# Patient Record
Sex: Male | Born: 1985 | Race: Black or African American | Hispanic: No | Smoking: Current every day smoker
Health system: Southern US, Community
[De-identification: ages and names within clinical notes are randomized; demographics above are authoritative.]

## PROBLEM LIST (undated history)

## (undated) DIAGNOSIS — J189 Pneumonia, unspecified organism: Secondary | ICD-10-CM

## (undated) DIAGNOSIS — F329 Major depressive disorder, single episode, unspecified: Secondary | ICD-10-CM

## (undated) DIAGNOSIS — K219 Gastro-esophageal reflux disease without esophagitis: Secondary | ICD-10-CM

## (undated) DIAGNOSIS — F32A Depression, unspecified: Secondary | ICD-10-CM

## (undated) DIAGNOSIS — M1990 Unspecified osteoarthritis, unspecified site: Secondary | ICD-10-CM

## (undated) DIAGNOSIS — T7840XA Allergy, unspecified, initial encounter: Secondary | ICD-10-CM

## (undated) HISTORY — PX: COLONOSCOPY: SHX174

## (undated) HISTORY — PX: HIP SURGERY: SHX245

## (undated) HISTORY — DX: Allergy, unspecified, initial encounter: T78.40XA

## (undated) HISTORY — DX: Unspecified osteoarthritis, unspecified site: M19.90

## (undated) HISTORY — DX: Gastro-esophageal reflux disease without esophagitis: K21.9

## (undated) HISTORY — PX: KNEE SURGERY: SHX244

---

## 2012-02-20 ENCOUNTER — Encounter (HOSPITAL_COMMUNITY): Payer: Self-pay | Admitting: Family Medicine

## 2012-02-20 ENCOUNTER — Emergency Department (HOSPITAL_COMMUNITY)
Admission: EM | Admit: 2012-02-20 | Discharge: 2012-02-21 | Disposition: A | Discharge status: Home / Self Care | Payer: Self-pay | Attending: Emergency Medicine | Admitting: Emergency Medicine

## 2012-02-20 DIAGNOSIS — F329 Major depressive disorder, single episode, unspecified: Secondary | ICD-10-CM | POA: Insufficient documentation

## 2012-02-20 DIAGNOSIS — F17200 Nicotine dependence, unspecified, uncomplicated: Secondary | ICD-10-CM | POA: Insufficient documentation

## 2012-02-20 DIAGNOSIS — F1010 Alcohol abuse, uncomplicated: Secondary | ICD-10-CM | POA: Insufficient documentation

## 2012-02-20 DIAGNOSIS — F3289 Other specified depressive episodes: Secondary | ICD-10-CM | POA: Insufficient documentation

## 2012-02-20 DIAGNOSIS — F1020 Alcohol dependence, uncomplicated: Secondary | ICD-10-CM

## 2012-02-20 HISTORY — DX: Depression, unspecified: F32.A

## 2012-02-20 HISTORY — DX: Major depressive disorder, single episode, unspecified: F32.9

## 2012-02-20 LAB — RAPID URINE DRUG SCREEN, HOSP PERFORMED
Amphetamines: NOT DETECTED
Benzodiazepines: NOT DETECTED
Cocaine: NOT DETECTED
Opiates: NOT DETECTED
Tetrahydrocannabinol: POSITIVE — AB

## 2012-02-20 LAB — COMPREHENSIVE METABOLIC PANEL
ALT: 14 U/L (ref 0–53)
AST: 39 U/L — ABNORMAL HIGH (ref 0–37)
CO2: 25 mEq/L (ref 19–32)
Calcium: 9.2 mg/dL (ref 8.4–10.5)
GFR calc non Af Amer: >90 mL/min (ref >90)
Sodium: 141 mEq/L (ref 135–145)
Total Protein: 7 g/dL (ref 6.0–8.3)

## 2012-02-20 LAB — CBC
MCH: 32.6 pg (ref 26.0–34.0)
Platelets: 243 10*3/uL (ref 150–400)
RBC: 3.96 MIL/uL — ABNORMAL LOW (ref 4.22–5.81)

## 2012-02-20 MED ORDER — LORAZEPAM 2 MG/ML IJ SOLN: 1.0000 mg | Freq: Four times a day (QID) | INTRAMUSCULAR | Status: DC | PRN

## 2012-02-20 MED ORDER — ONDANSETRON HCL 4 MG PO TABS: 4.0000 mg | ORAL_TABLET | Freq: Three times a day (TID) | ORAL | Status: DC | PRN

## 2012-02-20 MED ORDER — LORAZEPAM 1 MG PO TABS
1.0000 mg | ORAL_TABLET | Freq: Four times a day (QID) | ORAL | Status: DC | PRN
  Administered 2012-02-20 – 2012-02-21 (×3): 1 mg via ORAL
  Filled 2012-02-20 (×4): qty 1

## 2012-02-20 MED ORDER — THIAMINE HCL 100 MG/ML IJ SOLN: 100.0000 mg | Freq: Every day | INTRAMUSCULAR | Status: DC

## 2012-02-20 MED ORDER — VITAMIN B-1 100 MG PO TABS
100.0000 mg | ORAL_TABLET | Freq: Every day | ORAL | Status: DC
  Administered 2012-02-20 – 2012-02-21 (×2): 100 mg via ORAL
  Filled 2012-02-20 (×2): qty 1

## 2012-02-20 MED ORDER — LORAZEPAM 1 MG PO TABS
0.0000 mg | ORAL_TABLET | Freq: Four times a day (QID) | ORAL | Status: DC
  Administered 2012-02-20 (×3): 1 mg via ORAL
  Filled 2012-02-20 (×2): qty 1

## 2012-02-20 MED ORDER — ALUM & MAG HYDROXIDE-SIMETH 200-200-20 MG/5ML PO SUSP: 30.0000 mL | ORAL | Status: DC | PRN

## 2012-02-20 MED ORDER — LORAZEPAM 1 MG PO TABS
0.0000 mg | ORAL_TABLET | Freq: Two times a day (BID) | ORAL | Status: DC
Start: 2012-02-22 — End: 2012-02-21

## 2012-02-20 MED ORDER — NICOTINE 21 MG/24HR TD PT24
21.0000 mg | MEDICATED_PATCH | Freq: Every day | TRANSDERMAL | Status: DC
  Administered 2012-02-20: 21 mg via TRANSDERMAL
  Filled 2012-02-20 (×2): qty 1

## 2012-02-20 MED ORDER — IBUPROFEN 600 MG PO TABS
600.0000 mg | ORAL_TABLET | Freq: Three times a day (TID) | ORAL | Status: DC | PRN
  Administered 2012-02-20 – 2012-02-21 (×2): 600 mg via ORAL
  Filled 2012-02-20 (×2): qty 1

## 2012-02-20 MED ORDER — ADULT MULTIVITAMIN W/MINERALS CH
1.0000 | ORAL_TABLET | Freq: Every day | ORAL | Status: DC
  Administered 2012-02-20 – 2012-02-21 (×2): 1 via ORAL
  Filled 2012-02-20 (×2): qty 1

## 2012-02-20 MED ORDER — FOLIC ACID 1 MG PO TABS
1.0000 mg | ORAL_TABLET | Freq: Every day | ORAL | Status: DC
  Administered 2012-02-20 – 2012-02-21 (×2): 1 mg via ORAL
  Filled 2012-02-20 (×2): qty 1

## 2012-02-20 NOTE — ED Notes (Signed)
Asked RN to let EDP know about the potassium level.

## 2012-02-20 NOTE — ED Notes (Signed)
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

## 2012-02-20 NOTE — ED Notes (Signed)
Repeat ethanol sent at 930am

## 2012-02-20 NOTE — ED Provider Notes (Signed)
Medical screening examination/treatment/procedure(s) were performed by non-physician practitioner and as supervising physician I was immediately available for consultation/collaboration.  Massie Mees M Averill Pons, MD 02/20/12 0628 

## 2012-02-20 NOTE — ED Provider Notes (Signed)
History     CSN: [id number]  Arrival date & time 02/20/12  0118   First MD Initiated Contact with Patient 02/20/12 0309      Chief Complaint  Patient presents with  . Medical Clearance    (Consider location/radiation/quality/duration/timing/severity/associated sxs/prior treatment) HPI History provided by pt.   Pt requests detox from alcohol.  Drinks approx 4-5 liquor drinks a day and has been doing so for several years.  Has been through an outpatient detox program in [place] in the past and has also attempted to quit on his own.  Has never experienced DTs.  Smokes marijuana but denies abusing any other substances.  Denies SI.  Most recent drink 2 hours ago.  Past Medical History  Diagnosis Date  . Depression     Past Surgical History  Procedure Date  . Hip surgery   . Knee surgery     No family history on file.  History  Substance Use Topics  . Smoking status: Current Every Day Smoker -- 0.2 packs/day  . Smokeless tobacco: Not on file  . Alcohol Use: Yes     Comment: "Alo"      Review of Systems  All other systems reviewed and are negative.    Allergies  Review of patient's allergies indicates no known allergies.  Home Medications  No current outpatient prescriptions on file.  BP 129/84  Pulse 109  Temp 98.4 F (36.9 C) (Oral)  Resp 20  SpO2 96%  Physical Exam  Nursing note and vitals reviewed. Constitutional: He is oriented to person, place, and time. He appears well-developed and well-nourished. No distress.  HENT:  Head: Normocephalic and atraumatic.  Eyes:       Normal appearance  Neck: Normal range of motion.  Cardiovascular: Normal rate and regular rhythm.   Pulmonary/Chest: Effort normal and breath sounds normal. No respiratory distress.  Musculoskeletal: Normal range of motion.  Neurological: He is alert and oriented to person, place, and time.       No tremors.  Mild slurring of speech.   Skin: Skin is warm and dry. No rash  noted.  Psychiatric: He has a normal mood and affect. His behavior is normal.    ED Course  Procedures (including critical care time)  Labs Reviewed  CBC - Abnormal; Notable for the following:    WBC 10.6 (*)     RBC 3.96 (*)     Hemoglobin 12.9 (*)     HCT 37.8 (*)     All other components within normal limits  COMPREHENSIVE METABOLIC PANEL - Abnormal; Notable for the following:    Potassium 3.3 (*)     AST 39 (*)     All other components within normal limits  ETHANOL - Abnormal; Notable for the following:    Alcohol, Ethyl (B) 232 (*)     All other components within normal limits  SALICYLATE LEVEL - Abnormal; Notable for the following:    Salicylate Lvl <2.0 (*)     All other components within normal limits  ACETAMINOPHEN LEVEL  URINE RAPID DRUG SCREEN (HOSP PERFORMED)   No results found.   1. Alcoholism       MDM  26yo M presents for alcohol detox.  Etoh 232 currently.  No signs of alcohol withdrawal on exam.  Will reassess when patient sober.  Repeat Etoh ordered for 8:15am. Withdrawal protocol initiated and holding orders written.  3:50 AM         Otilio Miu, PA-C 02/20/12 [phone]

## 2012-02-20 NOTE — ED Notes (Signed)
Pt changed into scrubs.  Pt was seen and searched by security.  1 bag placed in locker 2 triage.

## 2012-02-20 NOTE — ED Notes (Addendum)
Patient states that he is here for alcohol detox. States that he last used alcohol around 10pm. Has a history of alcohol abuse. Reports "drinking a lot" on a daily basis. Also, reports having a history of depression. Has never done a detox program. Patient states he has not taken his medication for depression in a month.

## 2012-02-20 NOTE — ED Provider Notes (Signed)
Signout from PA Schinlever at shift change. Plan is to f/u repeat EtOH and confirm that Pt wants detox after he sobers up.   0830 Pt seen and examined, no complaints, ambulates with a steady gait. He requests EtOH Detox. [facility] team consulted.  Wynetta Emery, PA-C 02/20/12 1230

## 2012-02-20 NOTE — ED Notes (Addendum)
Pt said it's hard staying in that little room but the stuff he witnessed earlier with other pt's acting out was disheartening for him and he'd have just taken the shot. Pt asked if he'd be here all night tonight or if he'd be transferring to a facility tonight.

## 2012-02-20 NOTE — BH Assessment (Signed)
Assessment Note   Darrell Sanders is a 26 y.o. male who requests detox from alcohol, THC and opiates(pain pills).  Pt denies SI/HI/Psych.  Pt reports he's been consuming 1 pint of liquor daily and 1 case of beer daily, last drink was 02/19/12---"I drink as much as I can get".  Pt uses 6 grams of THC daily, last use was 02/18/12 and abuses his prescribed pain pills; takes 4 pills daily, last use was 3 wks ago. Pt says he was prescribed pain pills due to hip surgery.  Pt told this Clinical research associate that he'd previously recv'd detox while in the [employer] in 2012.  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  Pt says he stopped using any substances except alcohol for approx 1 month, because he attempted to obtain employment and his drug screen came back positive.  Pt denies problems with seizures, says he experiences blackouts.  Pt currently with grandmother and has a good support system with mom/dad, they are at bedside with pt.     Axis I: Depressive D/O; Polysub Dep Axis II: Deferred Axis III:  Past Medical History  Diagnosis Date  . Depression    Axis IV: occupational problems, other psychosocial or environmental problems and problems related to social environment Axis V: 41-50 serious symptoms  Past Medical History:  Past Medical History  Diagnosis Date  . Depression     Past Surgical History  Procedure Date  . Hip surgery   . Knee surgery     Family History: No family history on file.  Social History:  reports that he has been smoking.  He does not have any smokeless tobacco history on file. He reports that he drinks alcohol. He reports that he uses illicit drugs (Marijuana and Opium).  Additional Social History:  Alcohol / Drug Use Pain Medications: See MAR  Prescriptions: See MAR  Over the Counter: See MAR  History of alcohol / drug use?: Yes Longest period of sobriety (when/how long): None    Negative Consequences of Use: Personal relationships Withdrawal Symptoms:  (No current w/d sxs ) Substance #1 Name of Substance 1: Alcohol 1 - Age of First Use: Teens  1 - Amount (size/oz): 1 Pint  1 - Frequency: Daily 1 - Duration: On-going  1 - Last Use / Amount: 02/19/12 Substance #2 Name of Substance 2: THC  2 - Age of First Use: Teens  2 - Amount (size/oz): 6 Grams  2 - Frequency: Daily  2 - Duration: On-going  2 - Last Use / Amount: 02/18/12 Substance #3 Name of Substance 3: Opiates--Pain Pills  3 - Age of First Use: 20's  3 - Amount (size/oz): 4 Pills  3 - Frequency: Daily  3 - Duration: On-going  3 - Last Use / Amount: 3 Wks Ago   CIWA: CIWA-Ar BP: 139/90 mmHg Pulse Rate: 89  Nausea and Vomiting: no nausea and no vomiting Tactile Disturbances: none Tremor: no tremor Auditory Disturbances: not present Paroxysmal Sweats: no sweat visible Visual Disturbances: not present Anxiety: no anxiety, at ease Headache, Fullness in Head: none present Agitation: normal activity Orientation and Clouding of Sensorium: oriented and can do serial additions CIWA-Ar Total: 0  COWS: Clinical Opiate Withdrawal Scale (COWS) Resting Pulse Rate: Pulse Rate 81-100 Sweating: No report of chills or flushing Restlessness: Able to sit still Pupil Size: Pupils pinned or normal size for room light Bone or Joint Aches: Not present  Runny Nose or Tearing: Not present GI Upset: No GI symptoms Tremor: No tremor Yawning: No yawning Anxiety or Irritability: None Gooseflesh Skin: Skin is smooth COWS Total Score: 1   Allergies: No Known Allergies  Home Medications:  (Not in a hospital admission)  OB/GYN Status:  No LMP for male patient.  General Assessment Data Location of Assessment: [facility] Living Arrangements: Other relatives (Lives with grandmother ) Can pt return to current living arrangement?: Yes Admission Status: Voluntary Is patient capable of signing voluntary admission?:  Yes Transfer from: Acute Hospital Referral Source: MD  Education Status Is patient currently in school?: No Current Grade: None  Highest grade of school patient has completed: None  Name of school: None  Contact person: None   Risk to self Suicidal Ideation: No Suicidal Intent: No Is patient at risk for suicide?: No Suicidal Plan?: No Access to Means: No What has been your use of drugs/alcohol within the last 12 months?: Abusing: Alcohol, Opiates, THC  Previous Attempts/Gestures: No How many times?: 0  Other Self Harm Risks: None  Triggers for Past Attempts: None known Intentional Self Injurious Behavior: None Family Suicide History: No Recent stressful life event(s): Other (Comment) (Hip Surgery(2013), Chronicity(SA)) Persecutory voices/beliefs?: No Depression: Yes Depression Symptoms: Loss of interest in usual pleasures Substance abuse history and/or treatment for substance abuse?: Yes Suicide prevention information given to non-admitted patients: Not applicable  Risk to Others Homicidal Ideation: No Thoughts of Harm to Others: No Current Homicidal Intent: No Current Homicidal Plan: No Access to Homicidal Means: No Identified Victim: None  History of harm to others?: No Assessment of Violence: None Noted Violent Behavior Description: None  Does patient have access to weapons?: No Criminal Charges Pending?: No Does patient have a court date: No  Psychosis Hallucinations: None noted Delusions: None noted  Mental Status Report Appear/Hygiene: Other (Comment) (Appropriate ) Eye Contact: Good Motor Activity: Unremarkable Speech: Logical/coherent Level of Consciousness: Alert Mood: Depressed Affect: Appropriate to circumstance;Depressed Anxiety Level: None Thought Processes: Coherent;Relevant Judgement: Unimpaired Orientation: Person;Place;Time;Situation Obsessive Compulsive Thoughts/Behaviors: None  Cognitive Functioning Concentration: Normal Memory:  Recent Intact;Remote Intact IQ: Average Insight: Good Impulse Control: Good Appetite: Good Weight Loss: 0  Weight Gain: 0  Sleep: No Change Total Hours of Sleep: 8  Vegetative Symptoms: None  ADLScreening [facility]) Patient's cognitive ability adequate to safely complete daily activities?: Yes Patient able to express need for assistance with ADLs?: Yes Independently performs ADLs?: Yes (appropriate for developmental age)  Abuse/Neglect [facility]) Physical Abuse: Denies Verbal Abuse: Denies Sexual Abuse: Denies  Prior Inpatient Therapy Prior Inpatient Therapy: Yes Prior Therapy Dates: 2013 Prior Therapy Facilty/Provider(s): 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 Reason for Treatment: Detox/Depression   Prior Outpatient Therapy Prior Outpatient Therapy: No Prior Therapy Dates: None  Prior Therapy Facilty/Provider(s): None  Reason for Treatment: None   ADL Screening (condition at time of admission) Patient's cognitive ability adequate to safely complete daily activities?: Yes Patient able to express need for assistance with ADLs?: Yes Independently performs ADLs?: Yes (appropriate for developmental age) Weakness of Legs: None Weakness of Arms/Hands: None  Home Assistive Devices/Equipment Home Assistive Devices/Equipment: None  Therapy Consults (therapy consults require a physician order) PT Evaluation Needed: No OT Evalulation Needed: No SLP Evaluation Needed: No Abuse/Neglect Assessment (Assessment to be complete while patient is alone) Physical Abuse: Denies Verbal Abuse: Denies Sexual Abuse: Denies Exploitation of patient/patient's resources: Denies Self-Neglect: Denies Values / Beliefs Cultural Requests During Hospitalization: None Spiritual Requests During Hospitalization: None Consults Spiritual Care  Consult Needed: No Social Work Consult Needed: No Merchant navy officer (For Healthcare) Advance Directive: Patient does not have advance  directive;Patient would not like information Pre-existing out of facility DNR order (yellow form or pink MOST form): No Nutrition Screen- [facility] Patient's home diet: Regular Have you recently lost weight without trying?: No Have you been eating poorly because of a decreased appetite?: No Malnutrition Screening Tool Score: 0   Additional Information 1:1 In Past 12 Months?: No CIRT Risk: No Elopement Risk: No Does patient have medical clearance?: Yes     Disposition:  Disposition Disposition of Patient: Inpatient treatment program;Referred to [facility] ) Type of inpatient treatment program: Adult Patient referred to: Other (Comment) [facility] )  On Site Evaluation by:   Reviewed with Physician:     Murrell Redden 02/20/2012 4:28 PM

## 2012-02-20 NOTE — ED Notes (Signed)
Pt cannot urinate.  Will try again in 30 minutes. 

## 2012-02-20 NOTE — ED Notes (Signed)
Pt. Belongings locked up in [place] [address] near the psy-ed. Pt. Has 1 belongings bag.

## 2012-02-20 NOTE — ED Notes (Signed)
Billy in lab notified of redraw ethanol level and sent down.

## 2012-02-20 NOTE — ED Notes (Signed)
Lab to process alcohol sent at 0930.

## 2012-02-21 LAB — BASIC METABOLIC PANEL
BUN: 14 mg/dL (ref 6–23)
CO2: 28 mEq/L (ref 19–32)
GFR calc non Af Amer: >90 mL/min (ref >90)
Glucose, Bld: 79 mg/dL (ref 70–99)
Potassium: 4 mEq/L (ref 3.5–5.1)

## 2012-02-21 MED ORDER — POTASSIUM CHLORIDE CRYS ER 20 MEQ PO TBCR
40.0000 meq | EXTENDED_RELEASE_TABLET | Freq: Once | ORAL | Status: AC
  Administered 2012-02-21: 40 meq via ORAL
  Filled 2012-02-21: qty 2

## 2012-02-21 NOTE — Progress Notes (Signed)
Pt accepted to [facility] per Delice Bison for Dr. Betti Cruz.  Pt will be picked up by [facility] between 5 and 7pm at [facility].  Nurse to call report prior to pt leaving ED - [phone].  No paper work needs to go with pt accept that which the hospital requires.  [facility] has all paper work from [facility] on what they need for tx.

## 2012-02-21 NOTE — Progress Notes (Signed)
[facility] has potassium level and Delice Bison is reviewing it and will contact nursing station to get more medical info.

## 2012-02-21 NOTE — ED Notes (Signed)
At this time, patient stated he does not know what he is going to do after his therapy. Cry, wants his Dad to visit him & watch the football game. Patient made up bed for his Father's visit.

## 2012-02-21 NOTE — ED Notes (Signed)
Reports feeling safe denies SI/homocideal. Stated feels anxious about the wait.

## 2012-02-21 NOTE — ED Notes (Signed)
Visitor is here. Patient is making eye contact and conversing with Father.

## 2012-02-21 NOTE — ED Provider Notes (Signed)
Patient without complaints.  Reportedly has bed at [facility] and we will arrange transport.    Hilario Quarry, MD 02/21/12 [phone]

## 2012-02-22 NOTE — ED Provider Notes (Signed)
Medical screening examination/treatment/procedure(s) were performed by non-physician practitioner and as supervising physician I was immediately available for consultation/collaboration.   Lyanne Co, MD 02/22/12 [phone]

## 2015-02-21 DIAGNOSIS — J189 Pneumonia, unspecified organism: Secondary | ICD-10-CM

## 2015-02-21 HISTORY — DX: Pneumonia, unspecified organism: J18.9

## 2015-02-25 ENCOUNTER — Emergency Department (HOSPITAL_COMMUNITY)
Admission: EM | Admit: 2015-02-25 | Discharge: 2015-02-25 | Disposition: A | Discharge status: Home / Self Care | Payer: Non-veteran care | Attending: Emergency Medicine | Admitting: Emergency Medicine

## 2015-02-25 ENCOUNTER — Emergency Department (HOSPITAL_COMMUNITY): Payer: Non-veteran care

## 2015-02-25 DIAGNOSIS — J159 Unspecified bacterial pneumonia: Secondary | ICD-10-CM | POA: Insufficient documentation

## 2015-02-25 DIAGNOSIS — Z8659 Personal history of other mental and behavioral disorders: Secondary | ICD-10-CM | POA: Insufficient documentation

## 2015-02-25 DIAGNOSIS — F17200 Nicotine dependence, unspecified, uncomplicated: Secondary | ICD-10-CM | POA: Diagnosis not present

## 2015-02-25 DIAGNOSIS — R05 Cough: Secondary | ICD-10-CM | POA: Diagnosis present

## 2015-02-25 DIAGNOSIS — J189 Pneumonia, unspecified organism: Secondary | ICD-10-CM

## 2015-02-25 LAB — CBC WITH DIFFERENTIAL/PLATELET
BASOS ABS: 0.1 10*3/uL (ref 0.0–0.1)
Basophils Relative: 0 %
Eosinophils Absolute: 0.4 10*3/uL (ref 0.0–0.7)
Eosinophils Relative: 3 %
HEMATOCRIT: 42.5 % (ref 39.0–52.0)
HEMOGLOBIN: 14 g/dL (ref 13.0–17.0)
LYMPHS ABS: 2.1 10*3/uL (ref 0.7–4.0)
LYMPHS PCT: 15 %
MCH: 32 pg (ref 26.0–34.0)
MCHC: 32.9 g/dL (ref 30.0–36.0)
MCV: 97.3 fL (ref 78.0–100.0)
Monocytes Absolute: 0.8 10*3/uL (ref 0.1–1.0)
Monocytes Relative: 6 %
NEUTROS ABS: 10 10*3/uL — AB (ref 1.7–7.7)
Neutrophils Relative %: 76 %
Platelets: 261 10*3/uL (ref 150–400)
RBC: 4.37 MIL/uL (ref 4.22–5.81)
RDW: 12.3 % (ref 11.5–15.5)
WBC: 13.3 10*3/uL — AB (ref 4.0–10.5)

## 2015-02-25 LAB — COMPREHENSIVE METABOLIC PANEL
ALBUMIN: 3.7 g/dL (ref 3.5–5.0)
ALK PHOS: 71 U/L (ref 38–126)
ALT: 16 U/L — AB (ref 17–63)
AST: 24 U/L (ref 15–41)
Anion gap: 8 (ref 5–15)
BILIRUBIN TOTAL: 0.9 mg/dL (ref 0.3–1.2)
BUN: 8 mg/dL (ref 6–20)
CALCIUM: 9.6 mg/dL (ref 8.9–10.3)
CO2: 28 mmol/L (ref 22–32)
Chloride: 101 mmol/L (ref 101–111)
Creatinine, Ser: 0.98 mg/dL (ref 0.61–1.24)
GFR calc Af Amer: >60 mL/min (ref >60)
GFR calc non Af Amer: >60 mL/min (ref >60)
GLUCOSE: 121 mg/dL — AB (ref 65–99)
Potassium: 4.2 mmol/L (ref 3.5–5.1)
Sodium: 137 mmol/L (ref 135–145)
TOTAL PROTEIN: 7.9 g/dL (ref 6.5–8.1)

## 2015-02-25 LAB — LIPASE, BLOOD: Lipase: 24 U/L (ref 11–51)

## 2015-02-25 MED ORDER — ONDANSETRON 4 MG PO TBDP
8.0000 mg | ORAL_TABLET | Freq: Once | ORAL | Status: AC
  Administered 2015-02-25: 8 mg via ORAL
  Filled 2015-02-25: qty 2

## 2015-02-25 MED ORDER — AZITHROMYCIN 250 MG PO TABS: 250.0000 mg | ORAL_TABLET | Freq: Every day | ORAL | Status: DC

## 2015-02-25 MED ORDER — ONDANSETRON 4 MG PO TBDP: 4.0000 mg | ORAL_TABLET | Freq: Three times a day (TID) | ORAL | Status: DC | PRN

## 2015-02-25 NOTE — ED Provider Notes (Signed)
CSN: [id number]     Arrival date & time 02/25/15  1049 History   First MD Initiated Contact with Patient 02/25/15 1207     No chief complaint on file.    (Consider location/radiation/quality/duration/timing/severity/associated sxs/prior Treatment) HPI Comments: Pt comes in with cough and congestion for the last 3 days. Has also had chills, vomiting and diarrhea. No medical problems, hasn't taken anything for the symptoms. States had diarrhea yesterday and 2 episodes of vomiting today.   The history is provided by the patient. No language interpreter was used.    Past Medical History  Diagnosis Date  . Depression    Past Surgical History  Procedure Laterality Date  . Hip surgery    . Knee surgery     No family history on file. Social History  Substance Use Topics  . Smoking status: Current Every Day Smoker -- 0.25 packs/day  . Smokeless tobacco: Not on file  . Alcohol Use: Yes     Comment: "Alo"    Review of Systems  All other systems reviewed and are negative.     Allergies  Review of patient's allergies indicates no known allergies.  Home Medications   Prior to Admission medications   Not on File   BP 129/90 mmHg  Pulse 102  Temp(Src) 98.2 F (36.8 C) (Oral)  Resp 20  Ht 5\' 9"  (1.753 m)  Wt 58.514 kg  BMI 19.04 kg/m2  SpO2 99% Physical Exam  Constitutional: He appears well-developed and well-nourished.  HENT:  Head: Normocephalic and atraumatic.  Right Ear: External ear normal.  Left Ear: External ear normal.  Nose: Rhinorrhea present.  Mouth/Throat: Posterior oropharyngeal erythema present.  Eyes: Conjunctivae are normal. Pupils are equal, round, and reactive to light.  Cardiovascular: Normal rate and regular rhythm.   Pulmonary/Chest: Effort normal. He has wheezes.  Nursing note and vitals reviewed.   ED Course  Procedures (including critical care time) Labs Review Labs Reviewed  CBC WITH DIFFERENTIAL/PLATELET - Abnormal; Notable for the  following:    WBC 13.3 (*)    Neutro Abs 10.0 (*)    All other components within normal limits  COMPREHENSIVE METABOLIC PANEL  LIPASE, BLOOD    Imaging Review Dg Chest 2 View  02/25/2015  CLINICAL DATA:  Cough, congestion, chills for 3 days EXAM: CHEST  2 VIEW COMPARISON:  None. FINDINGS: There is consolidation noted in the right middle lobe and lower lobe compatible with pneumonia. Left lung is clear. Heart is normal size. No effusions or acute bony abnormality. IMPRESSION: Right middle lobe/right lower lobe pneumonia. Electronically Signed   By: Charlett NoseKevin  Dover M.D.   On: 02/25/2015 13:03   I have personally reviewed and evaluated these images and lab results as part of my medical decision-making.   EKG Interpretation None      MDM   Final diagnoses:  Community acquired pneumonia    Pt is tolerating po. Pt is okay to go home with zithromax and zofran. Vitals stable   Teressa LowerVrinda Jariana Shumard, NP 02/25/15 1351  Alvira MondayErin Schlossman, MD 02/25/15 2234

## 2015-02-25 NOTE — Discharge Instructions (Signed)

## 2015-02-25 NOTE — ED Notes (Signed)
Pt transported to xray 

## 2015-02-25 NOTE — ED Provider Notes (Signed)
By signing my name below, I, [name], attest that this documentation has been prepared under the direction and in the presence of Arthor CaptainAbigail Saxon Barich, PA-C. Electronically Signed: [name], Neurosurgeoncribe. 02/25/2015. 12:14 PM.   HPI Comments: Darrell Sanders is a 29 y.o. male who presents to the Emergency Department complaining of 3 days intermittent cough productive of white sputum with associated subjective fever and chills. He also complains of nausea, 1 episode of vomiting, and diarrhea that began upon waking this morning. He reports associated decreased appetite. He is a regular smoker.   Physical Exam  Constitutional: Pt  is oriented to person, place, and time. Appears well-developed and well-nourished. No distress.  HENT:  Head: Normocephalic and atraumatic.  Right Ear: Tympanic membrane, external ear and ear canal normal.  Left Ear: Tympanic membrane, external ear and ear canal normal.  Nose: Mucosal edema and  rhinorrhea present. No epistaxis. Right sinus exhibits no maxillary sinus tenderness and no frontal sinus tenderness. Left sinus exhibits no maxillary sinus tenderness and no frontal sinus tenderness.  Mouth/Throat: Uvula is midline and mucous membranes are normal. Mucous membranes are not pale and not cyanotic. No oropharyngeal exudate, posterior oropharyngeal edema, posterior oropharyngeal erythema or tonsillar abscesses.  Eyes: Conjunctivae are normal. Pupils are equal, round, and reactive to light.  Neck: Normal range of motion and full passive range of motion without pain.  Cardiovascular: Normal rate and intact distal pulses.   Pulmonary/Chest: Effort normal and breath sounds normal. No stridor.  Clear and equal breath sounds without focal wheezes, rhonchi, rales  Abdominal: Soft. Bowel sounds are normal. There is no tenderness.  Musculoskeletal: Normal range of motion.  Lymphadenopathy:    Pt has no cervical adenopathy.  Neurological: Pt is alert and oriented to person,  place, and time.  Skin: Skin is warm and dry. No rash noted. Pt is not diaphoretic.  Psychiatric: Normal mood and affect.  Nursing note and vitals reviewed.   A&P: Will draw blood work and order CXR Will move to other side of fast track   I personally performed the services described in this documentation, which was scribed in my presence. The recorded information has been reviewed and is accurate.       Arthor Captainbigail Chontel Warning, PA-C 02/26/15 1706  Ambrose Finlandachel Morgan Clarene DukeLittle, MD 02/28/15 [phone]

## 2015-03-01 ENCOUNTER — Encounter (HOSPITAL_COMMUNITY): Payer: Self-pay | Admitting: Physical Medicine and Rehabilitation

## 2015-03-01 ENCOUNTER — Emergency Department (HOSPITAL_COMMUNITY): Payer: Non-veteran care

## 2015-03-01 ENCOUNTER — Inpatient Hospital Stay (HOSPITAL_COMMUNITY)
Admission: EM | Admit: 2015-03-01 | Discharge: 2015-03-02 | DRG: 194 | Disposition: A | Discharge status: Home / Self Care | Payer: Non-veteran care | Attending: Internal Medicine | Admitting: Internal Medicine

## 2015-03-01 DIAGNOSIS — E871 Hypo-osmolality and hyponatremia: Secondary | ICD-10-CM | POA: Diagnosis not present

## 2015-03-01 DIAGNOSIS — F329 Major depressive disorder, single episode, unspecified: Secondary | ICD-10-CM | POA: Diagnosis present

## 2015-03-01 DIAGNOSIS — F17210 Nicotine dependence, cigarettes, uncomplicated: Secondary | ICD-10-CM | POA: Diagnosis present

## 2015-03-01 DIAGNOSIS — J189 Pneumonia, unspecified organism: Secondary | ICD-10-CM | POA: Diagnosis not present

## 2015-03-01 DIAGNOSIS — J181 Lobar pneumonia, unspecified organism: Secondary | ICD-10-CM | POA: Diagnosis not present

## 2015-03-01 DIAGNOSIS — R509 Fever, unspecified: Secondary | ICD-10-CM | POA: Diagnosis present

## 2015-03-01 DIAGNOSIS — E876 Hypokalemia: Secondary | ICD-10-CM | POA: Diagnosis present

## 2015-03-01 DIAGNOSIS — D649 Anemia, unspecified: Secondary | ICD-10-CM | POA: Diagnosis present

## 2015-03-01 HISTORY — DX: Pneumonia, unspecified organism: J18.9

## 2015-03-01 LAB — CBC WITH DIFFERENTIAL/PLATELET
BASOS PCT: 0 %
Basophils Absolute: 0 10*3/uL (ref 0.0–0.1)
EOS ABS: 0.2 10*3/uL (ref 0.0–0.7)
EOS PCT: 1 %
HCT: 33.4 % — ABNORMAL LOW (ref 39.0–52.0)
Hemoglobin: 11.5 g/dL — ABNORMAL LOW (ref 13.0–17.0)
LYMPHS PCT: 12 %
Lymphs Abs: 2.3 10*3/uL (ref 0.7–4.0)
MCH: 32.5 pg (ref 26.0–34.0)
MCHC: 34.4 g/dL (ref 30.0–36.0)
MCV: 94.4 fL (ref 78.0–100.0)
Monocytes Absolute: 1.9 10*3/uL — ABNORMAL HIGH (ref 0.1–1.0)
Monocytes Relative: 10 %
NEUTROS PCT: 77 %
Neutro Abs: 14.8 10*3/uL — ABNORMAL HIGH (ref 1.7–7.7)
PLATELETS: UNDETERMINED 10*3/uL (ref 150–400)
RBC: 3.54 MIL/uL — AB (ref 4.22–5.81)
RDW: 12.3 % (ref 11.5–15.5)
WBC: 19.2 10*3/uL — ABNORMAL HIGH (ref 4.0–10.5)

## 2015-03-01 LAB — BASIC METABOLIC PANEL
ANION GAP: 8 (ref 5–15)
ANION GAP: 9 (ref 5–15)
BUN: 9 mg/dL (ref 6–20)
BUN: 6 mg/dL (ref 6–20)
CALCIUM: 8.2 mg/dL — AB (ref 8.9–10.3)
CO2: 23 mmol/L (ref 22–32)
CO2: 23 mmol/L (ref 22–32)
CREATININE: 0.94 mg/dL (ref 0.61–1.24)
Calcium: 8.6 mg/dL — ABNORMAL LOW (ref 8.9–10.3)
Chloride: 100 mmol/L — ABNORMAL LOW (ref 101–111)
Chloride: 104 mmol/L (ref 101–111)
Creatinine, Ser: 1.03 mg/dL (ref 0.61–1.24)
GFR calc Af Amer: >60 mL/min (ref >60)
GFR calc non Af Amer: >60 mL/min (ref >60)
GLUCOSE: 114 mg/dL — AB (ref 65–99)
GLUCOSE: 98 mg/dL (ref 65–99)
POTASSIUM: 3.4 mmol/L — AB (ref 3.5–5.1)
Potassium: 3.9 mmol/L (ref 3.5–5.1)
Sodium: 131 mmol/L — ABNORMAL LOW (ref 135–145)
Sodium: 136 mmol/L (ref 135–145)

## 2015-03-01 LAB — I-STAT CG4 LACTIC ACID, ED: Lactic Acid, Venous: 0.59 mmol/L (ref 0.5–2.0)

## 2015-03-01 LAB — CBC
HEMATOCRIT: 32 % — AB (ref 39.0–52.0)
Hemoglobin: 10.4 g/dL — ABNORMAL LOW (ref 13.0–17.0)
MCH: 31.3 pg (ref 26.0–34.0)
MCHC: 32.5 g/dL (ref 30.0–36.0)
MCV: 96.4 fL (ref 78.0–100.0)
PLATELETS: 310 10*3/uL (ref 150–400)
RBC: 3.32 MIL/uL — AB (ref 4.22–5.81)
RDW: 12.4 % (ref 11.5–15.5)
WBC: 16.7 10*3/uL — ABNORMAL HIGH (ref 4.0–10.5)

## 2015-03-01 LAB — EXPECTORATED SPUTUM ASSESSMENT W REFEX TO RESP CULTURE: SPECIAL REQUESTS: NORMAL

## 2015-03-01 LAB — STREP PNEUMONIAE URINARY ANTIGEN: STREP PNEUMO URINARY ANTIGEN: NEGATIVE

## 2015-03-01 MED ORDER — ZOLPIDEM TARTRATE 5 MG PO TABS
5.0000 mg | ORAL_TABLET | Freq: Every evening | ORAL | Status: DC | PRN
  Administered 2015-03-01 (×2): 5 mg via ORAL
  Filled 2015-03-01 (×2): qty 1

## 2015-03-01 MED ORDER — DEXTROSE 5 % IV SOLN
2.0000 g | Freq: Once | INTRAVENOUS | Status: AC
  Administered 2015-03-01: 2 g via INTRAVENOUS
  Filled 2015-03-01: qty 2

## 2015-03-01 MED ORDER — ACETAMINOPHEN 325 MG PO TABS: 650.0000 mg | ORAL_TABLET | Freq: Four times a day (QID) | ORAL | Status: DC | PRN

## 2015-03-01 MED ORDER — METOCLOPRAMIDE HCL 5 MG/ML IJ SOLN
10.0000 mg | Freq: Once | INTRAMUSCULAR | Status: AC
  Administered 2015-03-01: 10 mg via INTRAVENOUS
  Filled 2015-03-01: qty 2

## 2015-03-01 MED ORDER — ONDANSETRON HCL 4 MG PO TABS: 4.0000 mg | ORAL_TABLET | Freq: Four times a day (QID) | ORAL | Status: DC | PRN

## 2015-03-01 MED ORDER — ENOXAPARIN SODIUM 40 MG/0.4ML ~~LOC~~ SOLN
40.0000 mg | Freq: Every day | SUBCUTANEOUS | Status: DC
  Filled 2015-03-01 (×2): qty 0.4

## 2015-03-01 MED ORDER — KETOROLAC TROMETHAMINE 30 MG/ML IJ SOLN
30.0000 mg | Freq: Once | INTRAMUSCULAR | Status: AC
  Administered 2015-03-01: 30 mg via INTRAVENOUS
  Filled 2015-03-01: qty 1

## 2015-03-01 MED ORDER — ACETAMINOPHEN 500 MG PO TABS: 1000.0000 mg | ORAL_TABLET | Freq: Once | ORAL | Status: DC

## 2015-03-01 MED ORDER — DEXTROSE 5 % IV SOLN
1.0000 g | INTRAVENOUS | Status: DC
  Administered 2015-03-02: 1 g via INTRAVENOUS
  Filled 2015-03-01: qty 10

## 2015-03-01 MED ORDER — GUAIFENESIN-DM 100-10 MG/5ML PO SYRP
5.0000 mL | ORAL_SOLUTION | ORAL | Status: DC | PRN
  Administered 2015-03-01 (×2): 5 mL via ORAL
  Filled 2015-03-01 (×2): qty 5

## 2015-03-01 MED ORDER — ENSURE ENLIVE PO LIQD
237.0000 mL | Freq: Three times a day (TID) | ORAL | Status: DC
  Administered 2015-03-01 – 2015-03-02 (×2): 237 mL via ORAL

## 2015-03-01 MED ORDER — ENSURE ENLIVE PO LIQD: 237.0000 mL | Freq: Two times a day (BID) | ORAL | Status: DC

## 2015-03-01 MED ORDER — ONDANSETRON HCL 4 MG/2ML IJ SOLN: 4.0000 mg | Freq: Four times a day (QID) | INTRAMUSCULAR | Status: DC | PRN

## 2015-03-01 MED ORDER — DOXYCYCLINE HYCLATE 100 MG PO TABS
100.0000 mg | ORAL_TABLET | Freq: Two times a day (BID) | ORAL | Status: DC
  Administered 2015-03-01 – 2015-03-02 (×3): 100 mg via ORAL
  Filled 2015-03-01 (×3): qty 1

## 2015-03-01 MED ORDER — ACETAMINOPHEN 650 MG RE SUPP: 650.0000 mg | Freq: Four times a day (QID) | RECTAL | Status: DC | PRN

## 2015-03-01 MED ORDER — SODIUM CHLORIDE 0.9 % IV BOLUS (SEPSIS)
2000.0000 mL | Freq: Once | INTRAVENOUS | Status: AC
  Administered 2015-03-01: 2000 mL via INTRAVENOUS

## 2015-03-01 NOTE — ED Notes (Addendum)
Pt was recently diagnosed with pneumonia, states symptoms became worse tonight. Respirations unlabored. NAD

## 2015-03-01 NOTE — ED Notes (Signed)
[facility] placed on continuous pulse oximetry and blood pressure cuff

## 2015-03-01 NOTE — ED Provider Notes (Signed)
CSN: [id number]     Arrival date & time 03/01/15  0112 History  By signing my name below, I, [name], attest that this documentation has been prepared under the direction and in the presence of Tomasita CrumbleAdeleke Evah Rashid, MD. Electronically Signed: [name], ED Scribe. 03/01/2015. 2:33 AM     Chief Complaint  Patient presents with  . Headache  . Generalized Body Aches    The history is provided by the patient. No language interpreter was used.  Darrell Sanders is a 29 y.o. male who presents to the Emergency Department complaining of new, constant, 5/10 in severity, diffuse headache with onset yesterday (02/28/15) at work. Pt states that he was seen 4 days ago and diagnosed with pneumonia. He was taking the abx that he was discharged with as prescribed and states that his symptoms worsened tonight. Associated symptoms include myalgias, chills, rhinorrhea, SOB, and congestion. His cough is still present but has improved. Pt denies fever.  Past Medical History  Diagnosis Date  . Depression    Past Surgical History  Procedure Laterality Date  . Hip surgery    . Knee surgery     History reviewed. No pertinent family history. Social History  Substance Use Topics  . Smoking status: Current Every Day Smoker -- 0.25 packs/day    Types: Cigarettes  . Smokeless tobacco: None  . Alcohol Use: Yes    Review of Systems 10 Systems reviewed and all are negative for acute change except as noted in the HPI.  Allergies  Review of patient's allergies indicates no known allergies.  Home Medications   Prior to Admission medications   Medication Sig Start Date End Date Taking? Authorizing Provider  azithromycin (ZITHROMAX) 250 MG tablet Take 1 tablet (250 mg total) by mouth daily. Take first 2 tablets together, then 1 every day until finished. 02/25/15   Teressa LowerVrinda Pickering, NP  ondansetron (ZOFRAN ODT) 4 MG disintegrating tablet Take 1 tablet (4 mg total) by mouth every 8 (eight) hours as needed for  nausea or vomiting. 02/25/15   Teressa LowerVrinda Pickering, NP   BP 139/81 mmHg  Pulse 111  Temp(Src) 100.4 F (38 C) (Oral)  Resp 20  SpO2 95% Physical Exam  Constitutional: He is oriented to person, place, and time. Vital signs are normal. He appears well-developed and well-nourished.  Non-toxic appearance. He does not appear ill. No distress.  HENT:  Head: Normocephalic and atraumatic.  Nose: Nose normal.  Mouth/Throat: Oropharynx is clear and moist. No oropharyngeal exudate.  Eyes: Conjunctivae and EOM are normal. Pupils are equal, round, and reactive to light. No scleral icterus.  Neck: Normal range of motion. Neck supple. No tracheal deviation, no edema, no erythema and normal range of motion present. No thyroid mass and no thyromegaly present.  Cardiovascular: Regular rhythm, S1 normal, S2 normal, normal heart sounds, intact distal pulses and normal pulses.  Tachycardia present.  Exam reveals no gallop and no friction rub.   No murmur heard. Pulmonary/Chest: Effort normal and breath sounds normal. No respiratory distress. He has no wheezes. He has no rhonchi. He has no rales.  Abdominal: Soft. Normal appearance and bowel sounds are normal. He exhibits no distension, no ascites and no mass. There is no hepatosplenomegaly. There is no tenderness. There is no rebound, no guarding and no CVA tenderness.  Musculoskeletal: Normal range of motion. He exhibits no edema or tenderness.  Lymphadenopathy:    He has no cervical adenopathy.  Neurological: He is alert and oriented to person, place, and time.  He has normal strength. No cranial nerve deficit or sensory deficit.  Normal strength and sensation in all extremities.  Normal cerebellar testing.   Skin: Skin is warm, dry and intact. No petechiae and no rash noted. He is not diaphoretic. No erythema. No pallor.  Tactile fever.  Psychiatric: He has a normal mood and affect. His behavior is normal. Judgment normal.  Nursing note and vitals  reviewed.   ED Course  Procedures (including critical care time)  DIAGNOSTIC STUDIES: Oxygen Saturation is 95% on RA,  normal by my interpretation.    COORDINATION OF CARE: 1:24 AM Discussed treatment plan which includes lab work, CXR, IVF, Tylenol, Toradol, and Reglan with pt at bedside and pt agreed to plan.  2:33 AM-Consult complete with Dr. Maryfrances Bunnell ([facility]). Patient case explained and discussed. Agrees to admit patient for further evaluation and treatment. Call ended at 2:33 AM   Labs Review Labs Reviewed  CBC WITH DIFFERENTIAL/PLATELET - Abnormal; Notable for the following:    WBC 19.2 (*)    RBC 3.54 (*)    Hemoglobin 11.5 (*)    HCT 33.4 (*)    Neutro Abs 14.8 (*)    Monocytes Absolute 1.9 (*)    All other components within normal limits  BASIC METABOLIC PANEL - Abnormal; Notable for the following:    Sodium 131 (*)    Potassium 3.4 (*)    Chloride 100 (*)    Glucose, Bld 114 (*)    Calcium 8.6 (*)    All other components within normal limits  CULTURE, BLOOD (ROUTINE X 2)  CULTURE, BLOOD (ROUTINE X 2)  I-STAT CG4 LACTIC ACID, ED    Imaging Review Dg Chest 2 View  03/01/2015  CLINICAL DATA:  29 year old male with diagnosis of pneumonia 3 days ago. EXAM: CHEST  2 VIEW COMPARISON:  None. FINDINGS: There is persistent right middle and lobe lobe opacity which is increased compared to the prior study. No pleural effusion or pneumothorax identified. The cardiac silhouette is within normal limits. The osseous structures appear unremarkable. IMPRESSION: Interval progression of the right middle and lower lobe pneumonia. Follow-up resolution recommended. Electronically Signed   By: Elgie Collard M.D.   On: 03/01/2015 02:14   I have personally reviewed and evaluated these images and lab results as part of my medical decision-making.   EKG Interpretation None      MDM   Final diagnoses:  CAP (community acquired pneumonia)    Patient presents to the ED for  worsening shortness of breath and headache.  CXR reveals worsening pneumonia, it appears he has failed outpatient therapy for this.  Will obtain blood cultures and give ceftriaxone for treatment.  He was given toradol for headache and fever, also given IVF for tachycardia.  He was given reglan for headache as well.  I will page [facility] for further care.   I personally performed the services described in this documentation, which was scribed in my presence. The recorded information has been reviewed and is accurate.      Tomasita Crumble, MD 03/01/15 [phone]

## 2015-03-01 NOTE — H&P (Signed)
History and Physical  Patient Name: Darrell Sanders     ZOX:[id number]    DOB: [birth year]    DOA: 03/01/2015 Referring physician: Dereck Leep, MD PCP: PROVIDER NOT IN SYSTEM      Chief Complaint: Dyspnea and cough  HPI: Darrell Sanders is a 29 y.o. male with no significant past medical history who presents with worsening pneumonia.  The patient was in his normal state of health until one week ago when he developed a productive cough with fever and chills.  He presented to the emergency room, where he had normal renal function, mild leukocytosis, and chest x-ray with a new right lower lobe infiltrate. He was discharged with azithromycin.  He felt better in the next 2 days, until today when he developed new chest heaviness, shakes, feeling short of breath, and worsening productive cough.  In the ED, the patient had a low-grade fever, tachycardia, tachypnea, and low-normal SPO2. His white blood cell count had increased to 19.2 K/uL from 13, and he had anemia, hyponatremia, and hypokalemia.  A chest x-ray showed progression of his lobar pneumonia.  He was administered 2L NS and ceftriaxone 2g.  [facility] were asked to admit for CAP failed outpatient therapy.     Review of Systems:  Pt complains of chest heaviness, shakes, low-grade fever, posttussive emesis, headache, productive cough. All other systems negative except as just noted or noted in the history of present illness.   Allergies: No Known Allergies  Home medications: 1. Z-Pak, taken each day, to complete tomorrow.  Past medical history: 1. Genital HSV  Past surgical history: 1. Right knee arthroscopy 2. Bone spur shaving on left hip  Family history:  Father, hypertension.  Social History:  Patient lives with his grandmother. He is a smoker and smokes about 6 cigarettes per day. He uses cocaine but does not use injection drugs. No new sexual partners in the last 12 months.  He works in a hotel.      Physical Exam: BP  119/77 mmHg  Pulse 99  Temp(Src) 100.4 F (38 C) (Oral)  Resp 21  Ht  (1.753 m)  Wt 56.7 kg (125 lb)  BMI 18.45 kg/m2  SpO2 93% General appearance: Well-developed, adult male, alert and in no acute distress.   Eyes: Anicteric, conjunctiva pink, lids and lashes normal.     ENT: No nasal deformity, discharge, or epistaxis.  OP moist without lesions.   Skin: Warm and dry.  No suspicious rashes or lesions. Cardiac: RRR, nl S1-S2, no murmurs appreciated.   Respiratory: Normal respiratory rate and rhythm.  No rales appreciated. Abdomen: Abdomen soft without rigidity.  No TTP. No ascites, distension.  No hernia where he indicates. Neuro: Sensorium intact and responding to questions, attention normal.  Speech is fluent.  Moves all extremities equally and with normal coordination.    Psych: Behavior appropriate.  Affect normal.  No evidence of aural or visual hallucinations or delusions.       Labs on Admission:  The metabolic panel shows hyponatremia, hypokalemia. The lactic acid level is normal. The complete blood count shows leukocytosis and anemia.   Radiological Exams on Admission: Personally reviewed: Dg Chest 2 View  03/01/2015  CLINICAL DATA:  29 year old male with diagnosis of pneumonia 3 days ago. EXAM: CHEST  2 VIEW COMPARISON:  None. FINDINGS: There is persistent right middle and lobe lobe opacity which is increased compared to the prior study. No pleural effusion or pneumothorax identified. The cardiac silhouette is within normal limits. The osseous  structures appear unremarkable. IMPRESSION: Interval progression of the right middle and lower lobe pneumonia. Follow-up resolution recommended. Electronically Signed   By: Elgie CollardArash  Radparvar M.D.   On: 03/01/2015 02:14        Assessment/Plan 1. CAP:  Fever, productive cough, leukocytosis and chest heaviness with lobar infiltrate on CXR.  At the time of my assessment, the patient appears stable for a general medical  floor. -Ceftriaxone 1 g daily -Sputum culture and gram stain -Follow blood cultures -Supplemental oxygen as needed.  2. Hyponatremia: Suspect hypovolemic hyponatremia. -Repeat BMP  3. Anemia: This is new.  Unclear etiology.  Suspect this is artifact given clumping of platelets.  No clinical history of bleeding. -Repeat CBC -Iron studies/etc if not resolved  4. Hypokalemia: Mild.   -Supplement if not improved with IV fluids    DVT PPx: Lovenox Diet: Regular Consultants: None Code Status: Full Family Communication: Grandmother, present at bedside  Medical decision making: What exists of the patient's previous chart was reviewed in depth and the case was discussed with Dr. Mora Bellmanni. Patient seen 2:54 AM on 03/01/2015.  Disposition Plan:  Admit for IV antibiotics.  Anticipate transition to oral antibiotics and discharge within 3 days.      Alberteen SamChristopher P Danford [facility] Pager [phone]

## 2015-03-01 NOTE — Progress Notes (Signed)
Just arrived to 6n13 from ED. Denies nausea, has chest pain when coughs, mother at bedside, oriented to room and surroundings

## 2015-03-01 NOTE — ED Notes (Signed)
MD at bedside. 

## 2015-03-01 NOTE — Progress Notes (Signed)
PATIENT DETAILS Name: Darrell Sanders Age: 29 y.o. Sex: male Date of Birth: 09/06/1985 Admit Date: 03/01/2015 Admitting Physician Alberteen Samhristopher P Danford, MD JXB:JYNWGNFAPCP:PROVIDER NOT IN SYSTEM  Subjective: Feels better.  Assessment/Plan: Principal Problem:  CAP (community acquired pneumonia): Improved, continue Rocephin, will add doxycycline for atypical coverage. Cultures pending, check HIV, urine Legionella/streptococcal antigen. Suspect needs one additional day of hospitalization. If clinical improvement continues, home tomorrow morning.  Disposition: Remain inpatient-home tomorrow morning   Antimicrobial agents  See below  Anti-infectives    Start     Dose/Rate Route Frequency Ordered Stop   03/02/15 0200  cefTRIAXone (ROCEPHIN) 1 g in dextrose 5 % 50 mL IVPB     1 g 100 mL/hr over 30 Minutes Intravenous Every 24 hours 03/01/15 0356 03/09/15 0159   03/01/15 1100  doxycycline (VIBRA-TABS) tablet 100 mg     100 mg Oral Every 12 hours 03/01/15 1057     03/01/15 0215  cefTRIAXone (ROCEPHIN) 2 g in dextrose 5 % 50 mL IVPB     2 g 100 mL/hr over 30 Minutes Intravenous  Once 03/01/15 0210 03/01/15 0316      DVT Prophylaxis: Prophylactic Lovenox   Code Status: Full code  Family Communication None at bedside  Procedures: None  CONSULTS:  None  Time spent 25 minutes-Greater than 50% of this time was spent in counseling, explanation of diagnosis, planning of further management, and coordination of care.  MEDICATIONS: Scheduled Meds: . [START ON 03/02/2015] cefTRIAXone (ROCEPHIN)  IV  1 g Intravenous Q24H  . doxycycline  100 mg Oral Q12H  . enoxaparin (LOVENOX) injection  40 mg Subcutaneous Daily   Continuous Infusions:  PRN Meds:.acetaminophen **OR** acetaminophen, guaiFENesin-dextromethorphan, ondansetron **OR** ondansetron (ZOFRAN) IV, zolpidem    PHYSICAL EXAM: Vital signs in last 24 hours: Filed Vitals:   03/01/15 1130 03/01/15 1145 03/01/15  1200 03/01/15 1336  BP: 104/77 118/92 119/85 113/84  Pulse: 81 80 80 88  Temp:    98.2 F (36.8 C)  TempSrc:    Oral  Resp:  16  16  Height:      Weight:      SpO2: 100% 98% 99% 99%    Weight change:  Filed Weights   03/01/15 0146  Weight: 56.7 kg (125 lb)   Body mass index is 18.45 kg/(m^2).   Gen Exam: Awake and alert with clear speech.   Neck: Supple, No JVD.   Chest: B/L Clear.   CVS: S1 S2 Regular, no murmurs. Abdomen: soft, BS +, non tender, non distended.  Extremities: no edema, lower extremities warm to touch. Neurologic: Non Focal.   Skin: No Rash.   Wounds: N/A.   Intake/Output from previous day: No intake or output data in the 24 hours ending 03/01/15 1353   LAB RESULTS: CBC  Recent Labs Lab 02/25/15 1227 03/01/15 0143 03/01/15 0811  WBC 13.3* 19.2* 16.7*  HGB 14.0 11.5* 10.4*  HCT 42.5 33.4* 32.0*  PLT 261 PLATELET CLUMPS NOTED ON SMEAR, UNABLE TO ESTIMATE 310  MCV 97.3 94.4 96.4  MCH 32.0 32.5 31.3  MCHC 32.9 34.4 32.5  RDW 12.3 12.3 12.4  LYMPHSABS 2.1 2.3  --   MONOABS 0.8 1.9*  --   EOSABS 0.4 0.2  --   BASOSABS 0.1 0.0  --     Chemistries   Recent Labs Lab 02/25/15 1227 03/01/15 0143 03/01/15 0811  NA 137 131* 136  K 4.2 3.4*  3.9  CL 101 100* 104  CO2 GLUCOSE 121* 114* 98  BUN CREATININE 0.98 1.03 0.94  CALCIUM 9.6 8.6* 8.2*    CBG: No results for input(s): GLUCAP in the last 168 hours.  GFR Estimated Creatinine Clearance: 91.3 mL/min (by C-G formula based on Cr of 0.94).  Coagulation profile No results for input(s): INR, PROTIME in the last 168 hours.  Cardiac Enzymes No results for input(s): CKMB, TROPONINI, MYOGLOBIN in the last 168 hours.  Invalid input(s): CK  Invalid input(s): POCBNP No results for input(s): DDIMER in the last 72 hours. No results for input(s): HGBA1C in the last 72 hours. No results for input(s): CHOL, HDL, LDLCALC, TRIG, CHOLHDL, LDLDIRECT in the last 72 hours. No  results for input(s): TSH, T4TOTAL, T3FREE, THYROIDAB in the last 72 hours.  Invalid input(s): FREET3 No results for input(s): VITAMINB12, FOLATE, FERRITIN, TIBC, IRON, RETICCTPCT in the last 72 hours. No results for input(s): LIPASE, AMYLASE in the last 72 hours.  Urine Studies No results for input(s): UHGB, CRYS in the last 72 hours.  Invalid input(s): UACOL, UAPR, USPG, UPH, UTP, UGL, UKET, UBIL, UNIT, UROB, ULEU, UEPI, UWBC, URBC, UBAC, CAST, UCOM, BILUA  MICROBIOLOGY: Recent Results (from the past 240 hour(s))  Culture, sputum-assessment     Status: None   Collection Time: 03/01/15  2:59 AM  Result Value Ref Range Status   Specimen Description EXPECTORATED SPUTUM  Final   Special Requests Normal  Final   Sputum evaluation   Final    THIS SPECIMEN IS ACCEPTABLE. RESPIRATORY CULTURE REPORT TO FOLLOW.   Report Status 03/01/2015 FINAL  Final    RADIOLOGY STUDIES/RESULTS: Dg Chest 2 View  03/01/2015  CLINICAL DATA:  29 year old male with diagnosis of pneumonia 3 days ago. EXAM: CHEST  2 VIEW COMPARISON:  None. FINDINGS: There is persistent right middle and lobe lobe opacity which is increased compared to the prior study. No pleural effusion or pneumothorax identified. The cardiac silhouette is within normal limits. The osseous structures appear unremarkable. IMPRESSION: Interval progression of the right middle and lower lobe pneumonia. Follow-up resolution recommended. Electronically Signed   By: Elgie Collard M.D.   On: 03/01/2015 02:14   Dg Chest 2 View  02/25/2015  CLINICAL DATA:  Cough, congestion, chills for 3 days EXAM: CHEST  2 VIEW COMPARISON:  None. FINDINGS: There is consolidation noted in the right middle lobe and lower lobe compatible with pneumonia. Left lung is clear. Heart is normal size. No effusions or acute bony abnormality. IMPRESSION: Right middle lobe/right lower lobe pneumonia. Electronically Signed   By: Charlett Nose M.D.   On: 02/25/2015 13:03     Jeoffrey Massed, MD  [facility] Pager:[phone]  If 7PM-7AM, please contact night-coverage [email or web address] Password [email or web address] 03/01/2015, 1:53 PM   LOS: 0 days

## 2015-03-01 NOTE — Progress Notes (Signed)
Initial Nutrition Assessment  DOCUMENTATION CODES:   Non-severe (moderate) malnutrition in context of acute illness/injury  INTERVENTION:    Ensure Enlive PO TID, each supplement provides 350 kcal and 20 grams of protein  NUTRITION DIAGNOSIS:   Malnutrition related to acute illness as evidenced by percent weight loss, mild depletion of muscle mass (4% weight loss within the past week).  GOAL:   Patient will meet greater than or equal to 90% of their needs  MONITOR:   PO intake, Supplement acceptance, Weight trends  REASON FOR ASSESSMENT:   Malnutrition Screening Tool    ASSESSMENT:   29 y.o. male with no significant past medical history who presented with worsening pneumonia.  Patient reports usual weight 130-135 lbs; he has lost 4-7% of his usual weight over the past week when his symptoms started. He has been eating very poorly due to acute illness. He has had Ensure in the past and liked it, prefers vanilla and strawberry flavors, does not like chocolate. Discussed eating small meals and snacks/supplements between meals to ensure adequate intake for repletion of lean body mass.  Diet Order:  Diet regular Room service appropriate?: Yes; Fluid consistency:: Thin  Skin:  Reviewed, no issues  Last BM:  unknown  Height:   Ht Readings from Last 1 Encounters:  03/01/15 5\' 9"  (1.753 m)    Weight:   Wt Readings from Last 1 Encounters:  03/01/15 125 lb (56.7 kg)    Ideal Body Weight:  72.7 kg  BMI:  Body mass index is 18.45 kg/(m^2).  Estimated Nutritional Needs:   Kcal:  1800-2000  Protein:  85-100 gm  Fluid:  2 L  EDUCATION NEEDS:   Education needs addressed  Joaquin CourtsKimberly Harris, RD, LDN, CNSC Pager [phone] After Hours Pager [phone]

## 2015-03-02 LAB — HIV ANTIBODY (ROUTINE TESTING W REFLEX): HIV Screen 4th Generation wRfx: NONREACTIVE

## 2015-03-02 MED ORDER — ZOLPIDEM TARTRATE 5 MG PO TABS
5.0000 mg | ORAL_TABLET | Freq: Once | ORAL | Status: AC
  Administered 2015-03-02: 5 mg via ORAL
  Filled 2015-03-02: qty 1

## 2015-03-02 MED ORDER — ENSURE ENLIVE PO LIQD: 237.0000 mL | Freq: Three times a day (TID) | ORAL | Status: DC

## 2015-03-02 MED ORDER — AMOXICILLIN-POT CLAVULANATE 875-125 MG PO TABS: 1.0000 | ORAL_TABLET | Freq: Two times a day (BID) | ORAL | Status: DC

## 2015-03-02 NOTE — Progress Notes (Signed)
Feels much better-no fever-lungs completely clear on exam,requesting discharge-will d/c on augmentin.Patient already has had 2-3 days of Zithromax as outpatient and 2 days of Doxy while inpatient. Asked to follow with PCP.

## 2015-03-02 NOTE — Discharge Instructions (Signed)
[facility].   Please get a complete blood count and chemistry panel checked by your Primary MD at your next visit  Please ask your primary MD to follow Blood cultures and Urine Legionella Antigen (these are pending at the time of discharge)  You had Pneumonia or Lung problems: Please get a 2 view Chest X ray done in 3-4 weeks after hospital discharge or sooner if instructed your Hospilatist MD at the hospital.    Get Medicines reviewed and adjusted. Please take all your medications with you for your next visit with your Primary MD  Please request your Primary MD to go over all hospital tests and procedure/radiological results at the follow up, please ask your Primary MD to get all Hospital records sent to his/her office.  If you experience worsening of your admission symptoms, develop shortness of breath, life threatening emergency, suicidal or homicidal thoughts you must seek medical attention immediately by calling 911 or calling your MD immediately  if symptoms less severe.  You must read complete instructions/literature along with all the possible adverse reactions/side effects for all the Medicines you take and that have been prescribed to you. Take any new Medicines after you have completely understood and accpet all the possible adverse reactions/side effects.   Do not drive when taking Pain medications or sleeping medications (Benzodaizepines)  Do not take more than prescribed Pain, Sleep and Anxiety Medications  Special Instructions: If you have smoked or chewed Tobacco  in the last 2 yrs please stop smoking, stop any regular Alcohol  and or any Recreational drug use.  Wear Seat belts while driving.  Please note  You were cared for by a hospitalist during your hospital stay. Once you are discharged, your primary care physician will handle any further medical issues. Please note that NO REFILLS for any discharge medications will be  authorized once you are discharged, as it is imperative that you return to your primary care physician (or establish a relationship with a primary care physician if you do not have one) for your aftercare needs so that they can reassess your need for medications and monitor your lab values.

## 2015-03-02 NOTE — Progress Notes (Signed)
Discharge instructions gone over with patient. Home medications gone over with patient. Prescriptions given. Follow up appointment to [facility]. Specific instructions on completing the antibiotic regimen, diet, and activity gone over. Chest xray and lab work to be ordered by family physician ([facility]). Copy of hospital records will be requested by family physician. Patient declined information on stopping smoking and was not interested in my chart. Patient verbalized understanding of instructions.

## 2015-03-02 NOTE — Discharge Summary (Signed)
PATIENT DETAILS Name: Darrell Sanders Age: 29 y.o. Sex: male Date of Birth: [birth year] MRN: [id number]. Admitting Physician: Alberteen Sam, MD WUJ:[id number] NOT IN SYSTEM  Admit Date: 03/01/2015 Discharge date: 03/02/2015  Recommendations for Outpatient Follow-up:  1. Repeat Chest Xray in 3-4 weeks 2. Please repeat CBC/BMET at next visit 3. Please follow blood cultures till final  PRIMARY DISCHARGE DIAGNOSIS:  Principal Problem:   CAP (community acquired pneumonia)      PAST MEDICAL HISTORY: Past Medical History  Diagnosis Date  . Depression   . Pneumonia 02/2015    DISCHARGE MEDICATIONS: Current Discharge Medication List    START taking these medications   Details  amoxicillin-clavulanate (AUGMENTIN) 875-125 MG tablet Take 1 tablet by mouth 2 (two) times daily. Qty: 8 tablet, Refills: 0    feeding supplement, ENSURE ENLIVE, (ENSURE ENLIVE) LIQD Take 237 mLs by mouth 3 (three) times daily between meals. Qty: 90 Bottle, Refills: 0      CONTINUE these medications which have NOT CHANGED   Details  ondansetron (ZOFRAN ODT) 4 MG disintegrating tablet Take 1 tablet (4 mg total) by mouth every 8 (eight) hours as needed for nausea or vomiting. Qty: 20 tablet, Refills: 0        ALLERGIES:  No Known Allergies  BRIEF HPI:  See H&P, Labs, Consult and Test reports for all details in brief, patient  is a 29 y.o. male with no significant past medical history who developed a a productive cough with fever and chills. He presented to the emergency room, where he had normal renal function, mild leukocytosis, and chest x-ray with a new right lower lobe infiltrate. He was discharged with azithromycin. He felt better in the next 2 days-and on day of admission-worsened with fever/chills. His white blood cell count had increased to 19.2 K/uL from 13, and he had anemia, hyponatremia, and hypokalemia. A chest x-ray showed progression of his lobar pneumonia.He was  subsequenstly admitted for further evaluation and treatment  CONSULTATIONS:   None  PERTINENT RADIOLOGIC STUDIES: Dg Chest 2 View  03/01/2015  CLINICAL DATA:  29 year old male with diagnosis of pneumonia 3 days ago. EXAM: CHEST  2 VIEW COMPARISON:  None. FINDINGS: There is persistent right middle and lobe lobe opacity which is increased compared to the prior study. No pleural effusion or pneumothorax identified. The cardiac silhouette is within normal limits. The osseous structures appear unremarkable. IMPRESSION: Interval progression of the right middle and lower lobe pneumonia. Follow-up resolution recommended. Electronically Signed   By: Elgie Collard M.D.   On: 03/01/2015 02:14   Dg Chest 2 View  02/25/2015  CLINICAL DATA:  Cough, congestion, chills for 3 days EXAM: CHEST  2 VIEW COMPARISON:  None. FINDINGS: There is consolidation noted in the right middle lobe and lower lobe compatible with pneumonia. Left lung is clear. Heart is normal size. No effusions or acute bony abnormality. IMPRESSION: Right middle lobe/right lower lobe pneumonia. Electronically Signed   By: Charlett Nose M.D.   On: 02/25/2015 13:03     PERTINENT LAB RESULTS: CBC:  Recent Labs  03/01/15 0143 03/01/15 0811  WBC 19.2* 16.7*  HGB 11.5* 10.4*  HCT 33.4* 32.0*  PLT PLATELET CLUMPS NOTED ON SMEAR, UNABLE TO ESTIMATE 310   CMET CMP     Component Value Date/Time   NA 136 03/01/2015 0811   K 3.9 03/01/2015 0811   CL 104 03/01/2015 0811   CO2 23 03/01/2015 0811   GLUCOSE 98 03/01/2015 0811   BUN 6 03/01/2015  1610   CREATININE 0.94 03/01/2015 0811   CALCIUM 8.2* 03/01/2015 0811   PROT 7.9 02/25/2015 1227   ALBUMIN 3.7 02/25/2015 1227   AST 24 02/25/2015 1227   ALT 16* 02/25/2015 1227   ALKPHOS 71 02/25/2015 1227   BILITOT 0.9 02/25/2015 1227   GFRNONAA >60 03/01/2015 0811   GFRAA >60 03/01/2015 0811    GFR Estimated Creatinine Clearance: 91.3 mL/min (by C-G formula based on Cr of 0.94). No  results for input(s): LIPASE, AMYLASE in the last 72 hours. No results for input(s): CKTOTAL, CKMB, CKMBINDEX, TROPONINI in the last 72 hours. Invalid input(s): POCBNP No results for input(s): DDIMER in the last 72 hours. No results for input(s): HGBA1C in the last 72 hours. No results for input(s): CHOL, HDL, LDLCALC, TRIG, CHOLHDL, LDLDIRECT in the last 72 hours. No results for input(s): TSH, T4TOTAL, T3FREE, THYROIDAB in the last 72 hours.  Invalid input(s): FREET3 No results for input(s): VITAMINB12, FOLATE, FERRITIN, TIBC, IRON, RETICCTPCT in the last 72 hours. Coags: No results for input(s): INR in the last 72 hours.  Invalid input(s): PT Microbiology: Recent Results (from the past 240 hour(s))  Culture, sputum-assessment     Status: None   Collection Time: 03/01/15  2:59 AM  Result Value Ref Range Status   Specimen Description EXPECTORATED SPUTUM  Final   Special Requests Normal  Final   Sputum evaluation   Final    THIS SPECIMEN IS ACCEPTABLE. RESPIRATORY CULTURE REPORT TO FOLLOW.   Report Status 03/01/2015 FINAL  Final  Culture, respiratory (NON-Expectorated)     Status: None (Preliminary result)   Collection Time: 03/01/15  2:59 AM  Result Value Ref Range Status   Specimen Description EXPECTORATED SPUTUM  Final   Special Requests NONE  Final   Gram Stain   Final    MODERATE WBC PRESENT, PREDOMINANTLY PMN FEW SQUAMOUS EPITHELIAL CELLS PRESENT MODERATE GRAM POSITIVE COCCI IN PAIRS IN CLUSTERS IN CHAINS FEW GRAM NEGATIVE RODS Performed at [facility]    Culture PENDING  Incomplete   Report Status PENDING  Incomplete     BRIEF HOSPITAL COURSE:  CAP (community acquired pneumonia):Admitted and started on IV Rocephin and doxycycline, Feels much better-"almost 100%" per patient. HIV neg, blood cultures still pending at the time of discharge. But afebrile and non toxic appearing. Looks a lot better than yesterday. Will transition to oral Augmentin, does not require  atypical coverage as has completed several days of Zithromax and Doxycycline. Have asked him to follow with his PCP in 1 week to follow pending cultures and to repeat a CXR in 3-4 weeks.   TODAY-DAY OF DISCHARGE:  Subjective:   Constance Hackenberg today has no headache,no chest abdominal pain,no new weakness tingling or numbness, feels much better wants to go home today.  Objective:   Blood pressure 133/83, pulse 84, temperature 97.7 F (36.5 C), temperature source Oral, resp. rate 16, height  (1.753 m), weight 56.7 kg (125 lb), SpO2 99 %.  Intake/Output Summary (Last 24 hours) at 03/02/15 0959 Last data filed at 03/02/15 0551  Gross per 24 hour  Intake    720 ml  Output   1200 ml  Net   -480 ml   Filed Weights   03/01/15 0146  Weight: 56.7 kg (125 lb)    Exam Awake Alert, Oriented *3, No new F.N deficits, Normal affect [place].AT,PERRAL Supple Neck,No JVD, No cervical lymphadenopathy appriciated.  Symmetrical Chest wall movement, Good air movement bilaterally, CTAB RRR,No Gallops,Rubs or new Murmurs,  No Parasternal Heave +ve B.Sounds, Abd Soft, Non tender, No organomegaly appriciated, No rebound -guarding or rigidity. No Cyanosis, Clubbing or edema, No new Rash or bruise  DISCHARGE CONDITION: Stable  DISPOSITION: Home  DISCHARGE INSTRUCTIONS:    Activity:  As tolerated   Please get a complete blood count and chemistry panel checked by your Primary MD at your next visit  Please ask your primary MD to follow Blood cultures and Urine Legionella Antigen (these are pending at the time of discharge)  You had Pneumonia or Lung problems: Please get a 2 view Chest X ray done in 3-4 weeks after hospital discharge or sooner if instructed your Hospilatist MD at the hospital.  Get Medicines reviewed and adjusted: Please take all your medications with you for your next visit with your Primary MD  Please request your Primary MD to go over all hospital tests and  procedure/radiological results at the follow up, please ask your Primary MD to get all Hospital records sent to his/her office.  If you experience worsening of your admission symptoms, develop shortness of breath, life threatening emergency, suicidal or homicidal thoughts you must seek medical attention immediately by calling 911 or calling your MD immediately  if symptoms less severe.  You must read complete instructions/literature along with all the possible adverse reactions/side effects for all the Medicines you take and that have been prescribed to you. Take any new Medicines after you have completely understood and accpet all the possible adverse reactions/side effects.   Do not drive when taking Pain medications.   Do not take more than prescribed Pain, Sleep and Anxiety Medications  Special Instructions: If you have smoked or chewed Tobacco  in the last 2 yrs please stop smoking, stop any regular Alcohol  and or any Recreational drug use.  Wear Seat belts while driving.  Please note  You were cared for by a hospitalist during your hospital stay. Once you are discharged, your primary care physician will handle any further medical issues. Please note that NO REFILLS for any discharge medications will be authorized once you are discharged, as it is imperative that you return to your primary care physician (or establish a relationship with a primary care physician if you do not have one) for your aftercare needs so that they can reassess your need for medications and monitor your lab values.   Diet recommendation: Regular Diet  Discharge Instructions    Call MD for:  difficulty breathing, headache or visual disturbances    Complete by:  As directed      Call MD for:  temperature >100.4    Complete by:  As directed      Diet - low sodium heart healthy    Complete by:  As directed      Increase activity slowly    Complete by:  As directed            Follow-up Information     Schedule an appointment as soon as possible for a visit in 1 week to follow up.   Why:  Hospital follow up   Contact information:   [facility] Veatrice [facility]ellsWoods[facility]      Total Time spent on discharge equals 25  minutes.  SignedJeoffrey Massed: Amonie Wisser 03/02/2015 9:59 AM

## 2015-03-03 LAB — CULTURE, RESPIRATORY W GRAM STAIN: Culture: NORMAL

## 2015-03-04 LAB — LEGIONELLA ANTIGEN, URINE

## 2015-03-06 LAB — CULTURE, BLOOD (ROUTINE X 2)
CULTURE: NO GROWTH
CULTURE: NO GROWTH

## 2017-02-09 IMAGING — DX DG CHEST 2V
2 series · 2 of 2 positions shown · non-contrast
Comparison: None.

CLINICAL DATA: Cough, congestion, chills for 3 days

EXAM:
CHEST  2 VIEW

[w chest pa]
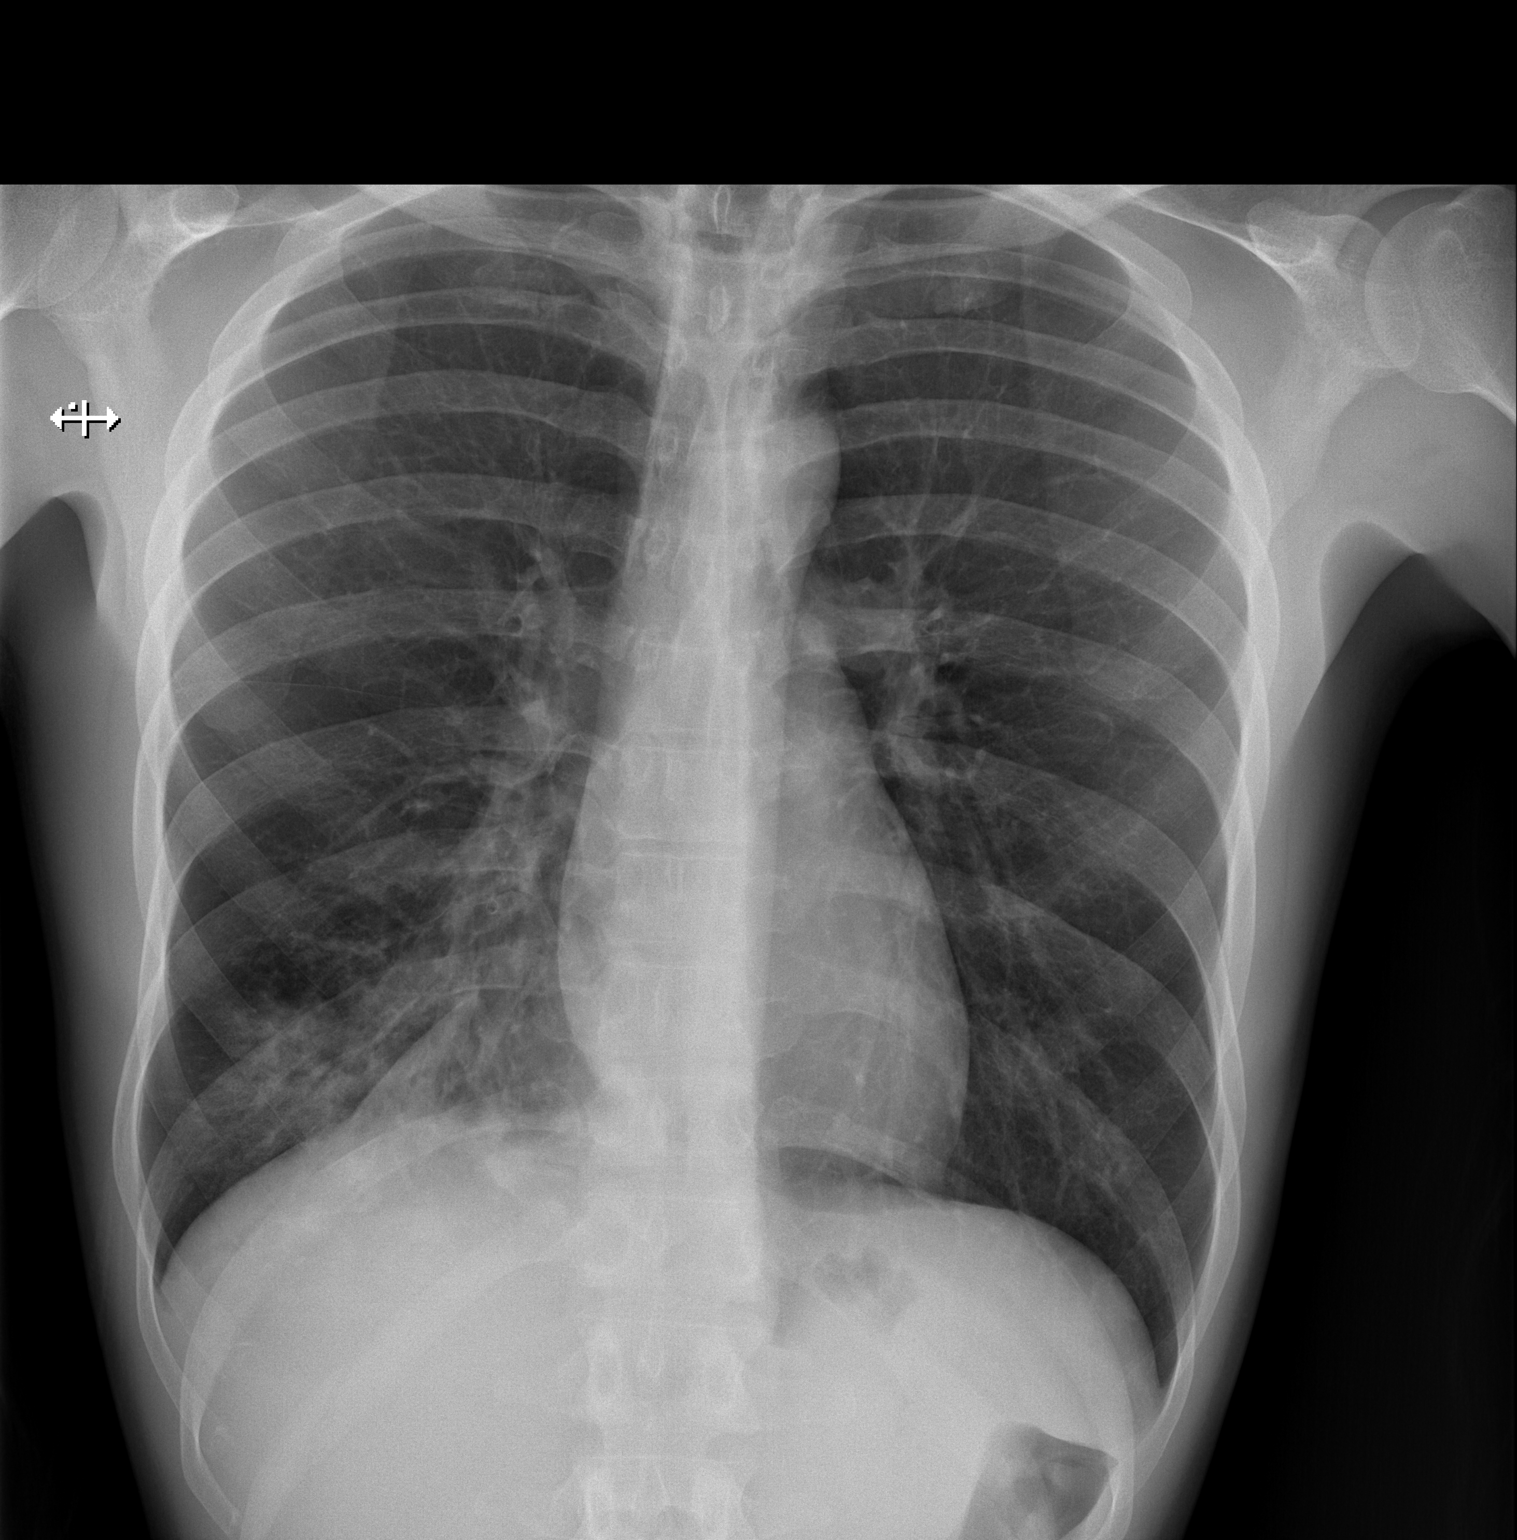

[w chest lat]
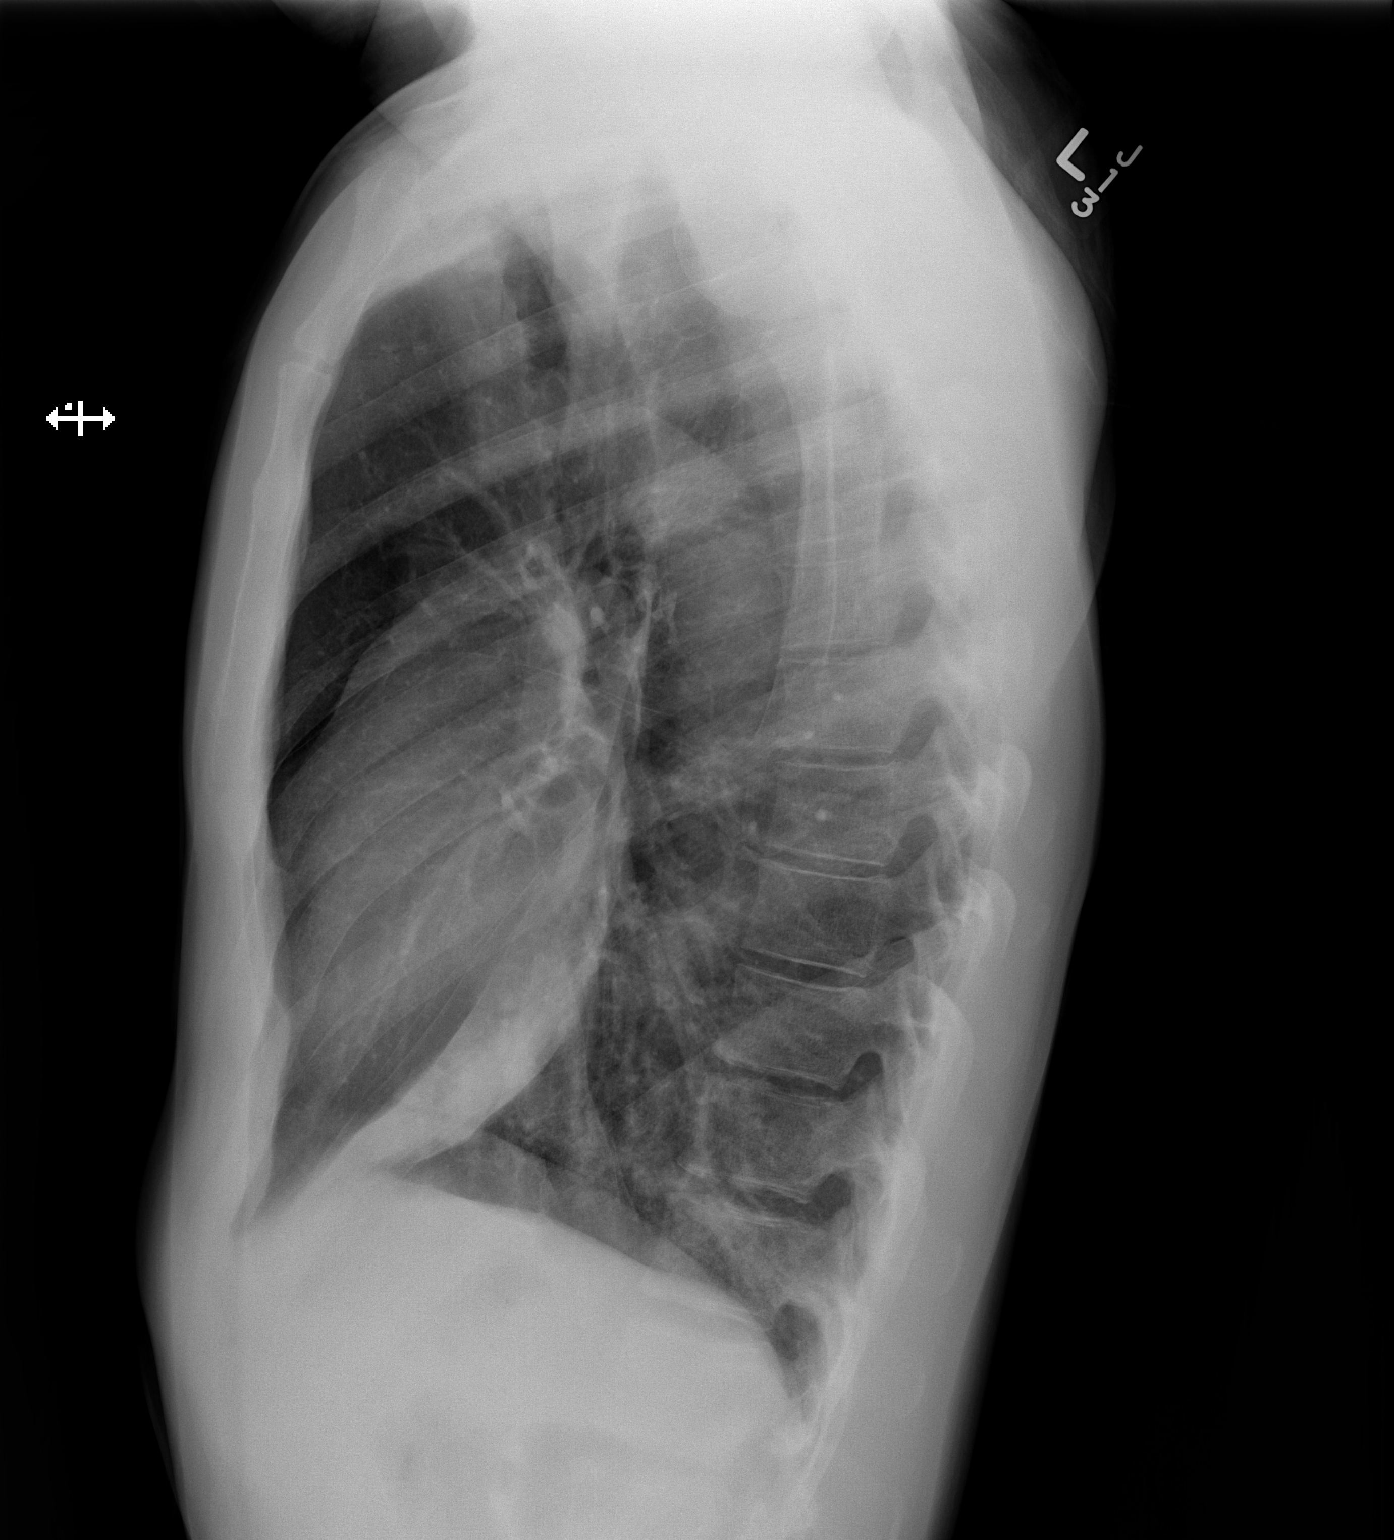

[2 of 2 positions shown; findings below may reference images not displayed]

FINDINGS: There is consolidation noted in the right middle lobe and lower lobe
compatible with pneumonia. Left lung is clear. Heart is normal size.
No effusions or acute bony abnormality.
IMPRESSION: Right middle lobe/right lower lobe pneumonia.

## 2017-02-13 IMAGING — DX DG CHEST 2V
2 series · 2 of 2 positions shown · non-contrast
Comparison: None.

CLINICAL DATA: 29-year-old male with diagnosis of pneumonia 3 days
ago.

EXAM:
CHEST  2 VIEW

[chest pa]
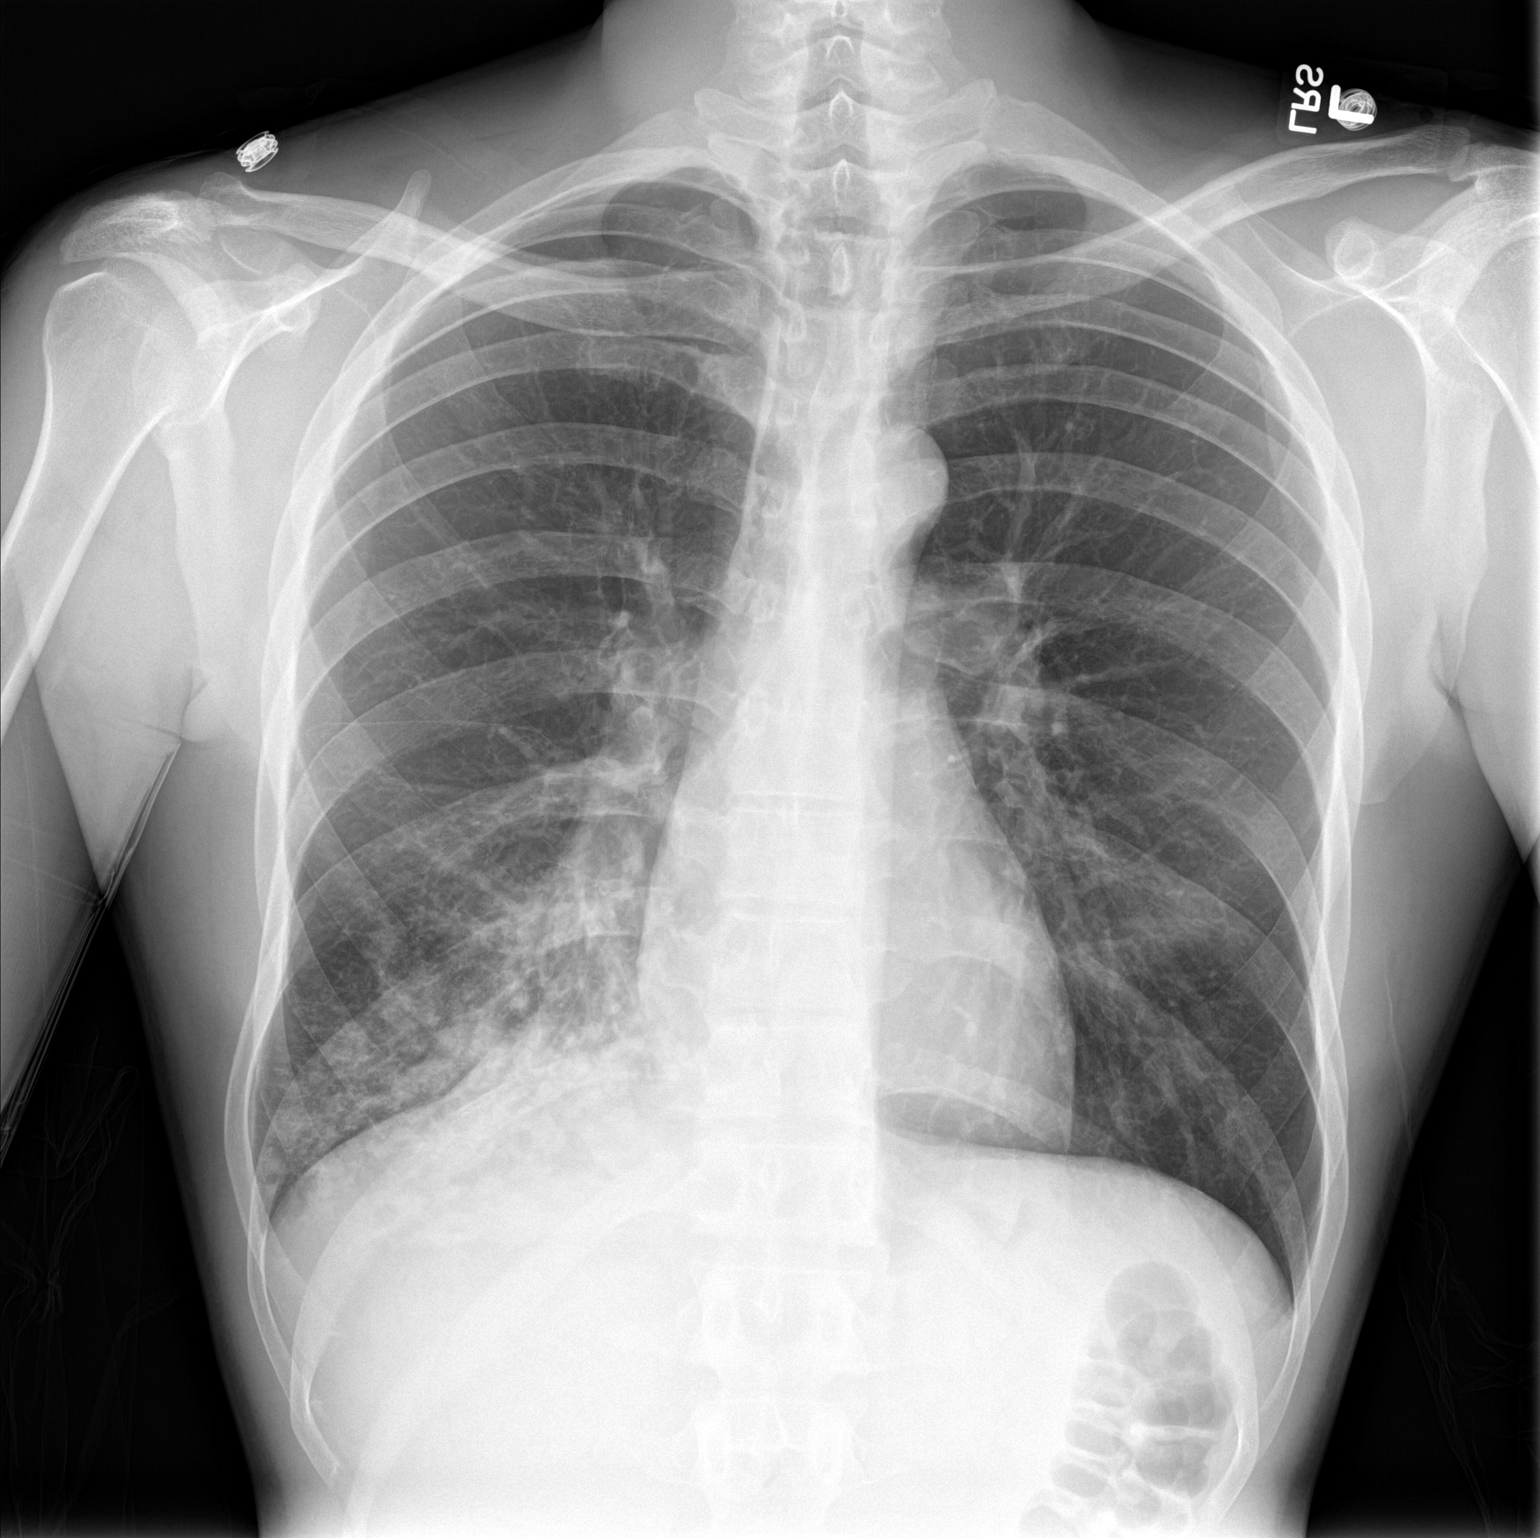

[chest lat]
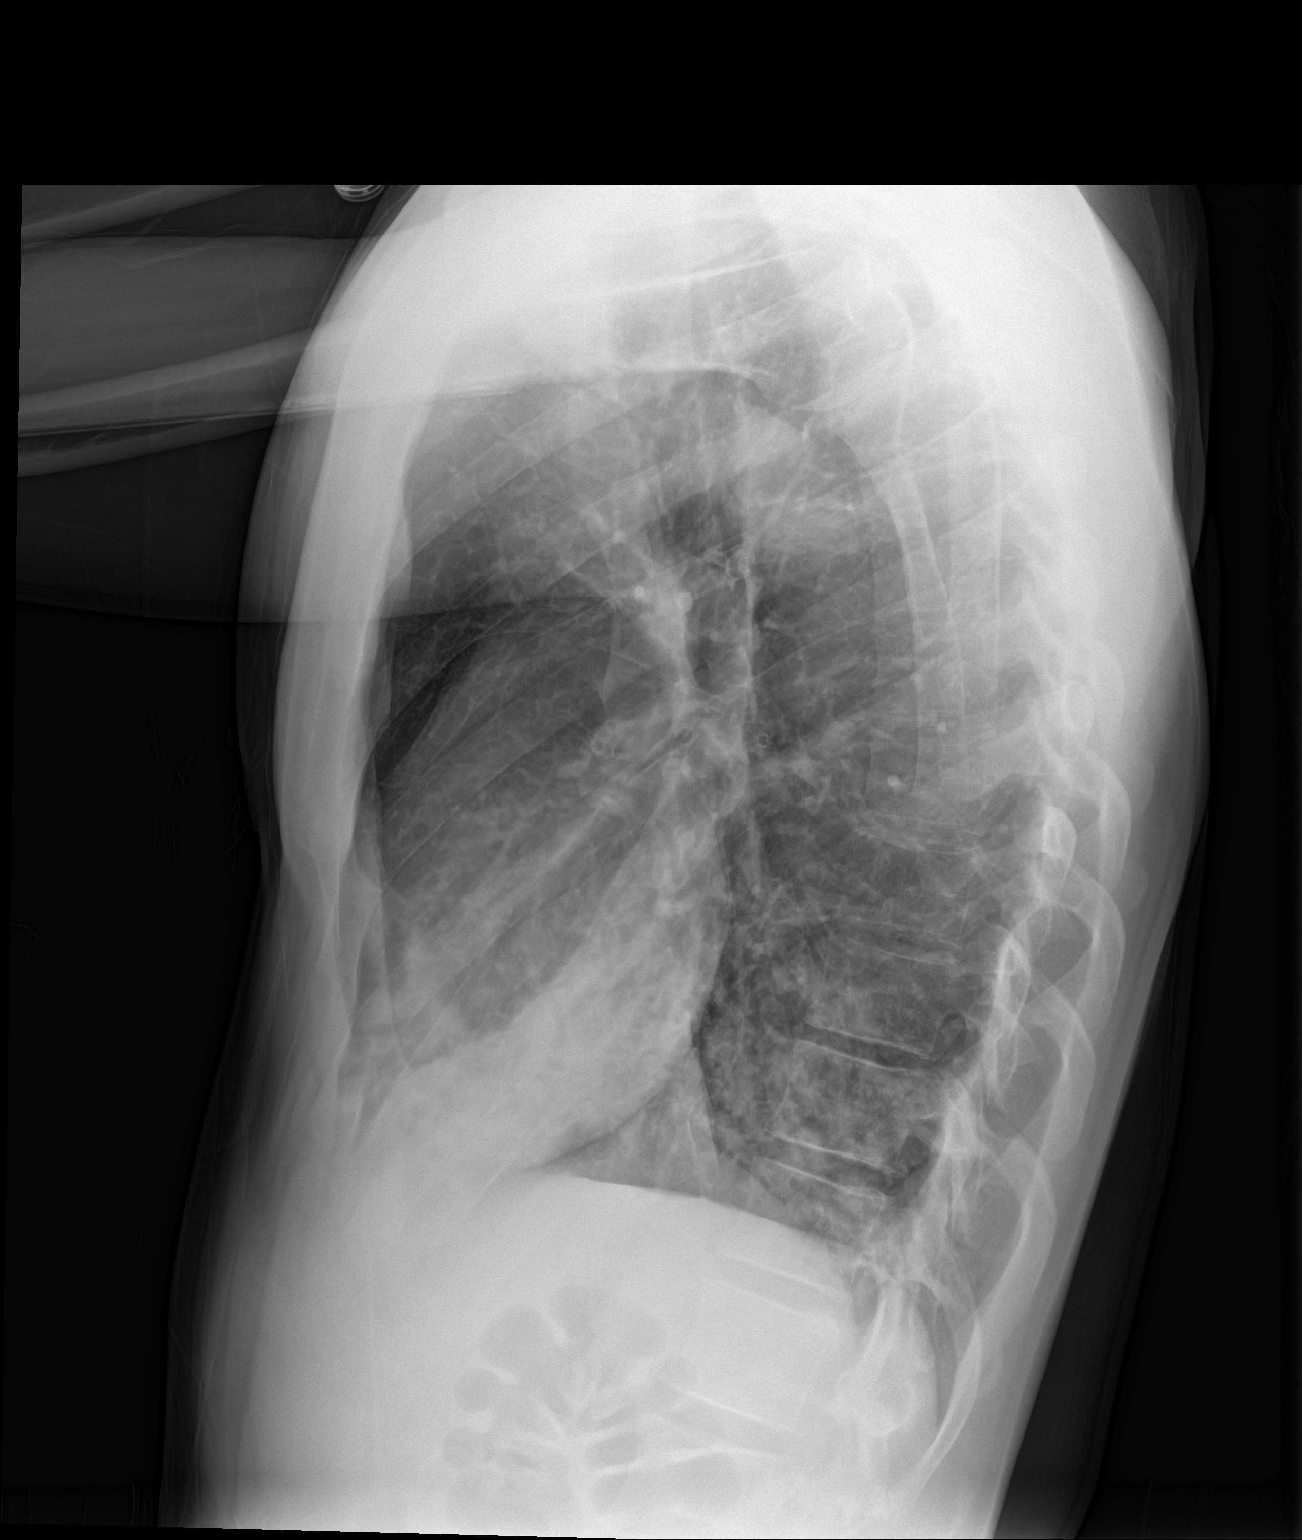

[2 of 2 positions shown; findings below may reference images not displayed]

FINDINGS: There is persistent right middle and lobe lobe opacity which is
increased compared to the prior study. No pleural effusion or
pneumothorax identified. The cardiac silhouette is within normal
limits. The osseous structures appear unremarkable.
IMPRESSION: Interval progression of the right middle and lower lobe pneumonia.
Follow-up resolution recommended.

## 2017-08-12 ENCOUNTER — Other Ambulatory Visit: Payer: Self-pay

## 2017-08-12 ENCOUNTER — Encounter (HOSPITAL_COMMUNITY): Payer: Self-pay | Admitting: Emergency Medicine

## 2017-08-12 ENCOUNTER — Emergency Department (HOSPITAL_COMMUNITY)
Admission: EM | Admit: 2017-08-12 | Discharge: 2017-08-12 | Discharge status: Against Medical Advice | Payer: Non-veteran care | Attending: Emergency Medicine | Admitting: Emergency Medicine

## 2017-08-12 DIAGNOSIS — Z008 Encounter for other general examination: Secondary | ICD-10-CM | POA: Diagnosis present

## 2017-08-12 DIAGNOSIS — Z5321 Procedure and treatment not carried out due to patient leaving prior to being seen by health care provider: Secondary | ICD-10-CM | POA: Diagnosis not present

## 2017-08-12 LAB — RAPID URINE DRUG SCREEN, HOSP PERFORMED
AMPHETAMINES: NOT DETECTED
BARBITURATES: NOT DETECTED
BENZODIAZEPINES: NOT DETECTED
COCAINE: POSITIVE — AB
Opiates: NOT DETECTED
TETRAHYDROCANNABINOL: POSITIVE — AB

## 2017-08-12 LAB — CBC
HEMATOCRIT: 47.7 % (ref 39.0–52.0)
HEMOGLOBIN: 15.8 g/dL (ref 13.0–17.0)
MCH: 33.3 pg (ref 26.0–34.0)
MCHC: 33.1 g/dL (ref 30.0–36.0)
MCV: 100.4 fL — AB (ref 78.0–100.0)
Platelets: 377 10*3/uL (ref 150–400)
RBC: 4.75 MIL/uL (ref 4.22–5.81)
RDW: 12.6 % (ref 11.5–15.5)
WBC: 8.6 10*3/uL (ref 4.0–10.5)

## 2017-08-12 LAB — COMPREHENSIVE METABOLIC PANEL
ALBUMIN: 3.7 g/dL (ref 3.5–5.0)
ALT: 11 U/L — ABNORMAL LOW (ref 17–63)
ANION GAP: 10 (ref 5–15)
AST: 20 U/L (ref 15–41)
Alkaline Phosphatase: 60 U/L (ref 38–126)
BILIRUBIN TOTAL: 0.5 mg/dL (ref 0.3–1.2)
BUN: 8 mg/dL (ref 6–20)
CO2: 28 mmol/L (ref 22–32)
Calcium: 8.7 mg/dL — ABNORMAL LOW (ref 8.9–10.3)
Chloride: 105 mmol/L (ref 101–111)
Creatinine, Ser: 1.22 mg/dL (ref 0.61–1.24)
GFR calc Af Amer: >60 mL/min (ref >60)
GLUCOSE: 88 mg/dL (ref 65–99)
Potassium: 3.8 mmol/L (ref 3.5–5.1)
Sodium: 143 mmol/L (ref 135–145)
Total Protein: 6.6 g/dL (ref 6.5–8.1)

## 2017-08-12 LAB — ETHANOL: ALCOHOL ETHYL (B): 182 mg/dL — AB (ref <10)

## 2017-08-12 NOTE — ED Notes (Signed)
Pt left. Pt stated that he was "going somewhere else to get some help".

## 2017-08-12 NOTE — ED Triage Notes (Signed)
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 Pt reports he goes through 2/5th liquor daily, last drink 0430. Hx depression. Denies SI/HI.

## 2020-07-21 DIAGNOSIS — S02609A Fracture of mandible, unspecified, initial encounter for closed fracture: Secondary | ICD-10-CM

## 2020-07-21 HISTORY — DX: Fracture of mandible, unspecified, initial encounter for closed fracture: S02.609A

## 2020-09-13 ENCOUNTER — Telehealth: Payer: Self-pay | Admitting: Internal Medicine

## 2020-09-13 NOTE — Telephone Encounter (Signed)
Hi Dr. Leone Payor, we have received a referral from the [facility] for epigastric pain. Patient recently saw a GI at the [place]. Records will be sent to you for review. Please advise on scheduling. Thank you.

## 2020-09-19 NOTE — Telephone Encounter (Signed)
Okay to schedule direct EGD for epigastric pain   Patient was seen at the [place] had a colonoscopy could not tolerate EGD with moderate sedation request for EGD using MAC sedation also history of black stool

## 2020-09-20 ENCOUNTER — Encounter: Payer: Self-pay | Admitting: Internal Medicine

## 2020-09-20 NOTE — Telephone Encounter (Signed)
EGD scheduled on 11/28/20 at 10:00am at [facility].

## 2020-11-14 ENCOUNTER — Ambulatory Visit (AMBULATORY_SURGERY_CENTER): Payer: Self-pay | Admitting: *Deleted

## 2020-11-14 ENCOUNTER — Other Ambulatory Visit: Payer: Self-pay

## 2020-11-14 VITALS — Ht 69.0 in | Wt 135.0 lb

## 2020-11-14 DIAGNOSIS — R1013 Epigastric pain: Secondary | ICD-10-CM

## 2020-11-14 NOTE — Progress Notes (Signed)

## 2020-11-28 ENCOUNTER — Encounter: Payer: Self-pay | Admitting: Internal Medicine

## 2020-11-28 ENCOUNTER — Other Ambulatory Visit: Payer: Self-pay

## 2020-11-28 ENCOUNTER — Ambulatory Visit (AMBULATORY_SURGERY_CENTER): Payer: No Typology Code available for payment source | Admitting: Internal Medicine

## 2020-11-28 VITALS — BP 106/80 | HR 78 | Temp 98.4°F | Resp 13 | Ht 69.0 in | Wt 135.0 lb

## 2020-11-28 DIAGNOSIS — R1013 Epigastric pain: Secondary | ICD-10-CM

## 2020-11-28 MED ORDER — SODIUM CHLORIDE 0.9 % IV SOLN: 500.0000 mL | Freq: Once | INTRAVENOUS | Status: DC

## 2020-11-28 NOTE — Op Note (Signed)
[facility] Patient Name: Hong Moring Procedure Date: 11/28/2020 9:27 AM MRN: [id number] Endoscopist: Iva Boop , MD Age: 35 Referring MD:  Date of Birth: 1985/12/22 Gender: Male Account #: [id number] Procedure:                Upper GI endoscopy Indications:              Epigastric abdominal pain Medicines:                Propofol per Anesthesia, Monitored Anesthesia Care Procedure:                Pre-Anesthesia Assessment:                           - Prior to the procedure, a History and Physical                            was performed, and patient medications and                            allergies were reviewed. The patient's tolerance of                            previous anesthesia was also reviewed. The risks                            and benefits of the procedure and the sedation                            options and risks were discussed with the patient.                            All questions were answered, and informed consent                            was obtained. Prior Anticoagulants: The patient has                            taken no previous anticoagulant or antiplatelet                            agents. ASA Grade Assessment: II - A patient with                            mild systemic disease. After reviewing the risks                            and benefits, the patient was deemed in                            satisfactory condition to undergo the procedure.                           After obtaining informed consent, the endoscope was  passed under direct vision. Throughout the                            procedure, the patient's blood pressure, pulse, and                            oxygen saturations were monitored continuously. The                            GIF [id number] #[id number] was introduced through the                            mouth, and advanced to the second part of duodenum.                            The  upper GI endoscopy was accomplished without                            difficulty. The patient tolerated the procedure                            well. Scope In: Scope Out: Findings:                 The examined esophagus was normal.                           Patchy mildly erythematous mucosa without bleeding                            was found in the gastric body and in the gastric                            antrum. Biopsies were taken with a cold forceps for                            histology. Verification of patient identification                            for the specimen was done. Estimated blood loss was                            minimal.                           The examined duodenum was normal.                           The cardia and gastric fundus were normal on                            retroflexion.                           The gastroesophageal flap valve was visualized  endoscopically and classified as Hill Grade I                            (prominent fold, tight to endoscope). Complications:            No immediate complications. Estimated Blood Loss:     Estimated blood loss was minimal. Impression:               - Normal esophagus.                           - Erythematous mucosa in the gastric body and                            antrum. Biopsied.                           - Normal examined duodenum.                           - Gastroesophageal flap valve classified as Hill                            Grade I (prominent fold, tight to endoscope). Recommendation:           - Patient has a contact number available for                            emergencies. The signs and symptoms of potential                            delayed complications were discussed with the                            patient. Return to normal activities tomorrow.                            Written discharge instructions were provided to the                             patient.                           - Resume previous diet.                           - Continue present medications.                           - Await pathology results.                           - will [facility] and [facility] [place] about                            pathology  Patient will follow-up at [facility] clinic Edison Pace, MD 11/28/2020 9:49:15 AM This report has been signed electronically.

## 2020-11-28 NOTE — Progress Notes (Signed)
Pt in recovery with monitors in place, VSS. Report given to receiving RN. Bite guard was placed with pt awake to ensure comfort. No dental or soft tissue damage noted. RN will remove the guard when the pt is awake.  

## 2020-11-28 NOTE — Progress Notes (Signed)
VS completed by DT.  Pt's states no medical or surgical changes since previsit or office visit.  

## 2020-11-28 NOTE — Progress Notes (Signed)
Darrell Sanders History and Physical   Primary Care Physician:  System, Provider Not In   Reason for Procedure:   Epigastric pain, dark stools  Plan:    egd     HPI: Darrell Sanders is a 35 y.o. male with above symptoms, unable to tolerate moderate sedation EGD and was referred forEGD using propofol   Past Medical History:  Diagnosis Date   Allergy    Arthritis    knee and back, hip   Closed jaw fracture (HCC) 07/2020   Depression    GERD (gastroesophageal reflux disease)    Pneumonia 02/21/2015    Past Surgical History:  Procedure Laterality Date   COLONOSCOPY     HIP SURGERY     Bone spurs removed   KNEE SURGERY     L arthorscopy    Prior to Admission medications   Medication Sig Start Date End Date Taking? Authorizing Provider  diphenhydrAMINE (BENADRYL) 25 mg capsule Take by mouth.   Yes [provider]  famotidine (PEPCID) 20 MG tablet TAKE ONE TABLET BY MOUTH TWICE A DAY FOR HIVES 03/20/20  Yes [provider]  montelukast (SINGULAIR) 10 MG tablet Take 1 tablet by mouth daily. 03/20/20  Yes [provider]  omeprazole (PRILOSEC) 40 MG capsule TAKE ONE CAPSULE BY MOUTH TWICE A DAY FOR ACID REFLUX 05/08/20  Yes [provider]  cetirizine (ZYRTEC) 10 MG tablet TAKE ONE TABLET BY MOUTH TWICE A DAY FOR URTICARIA AND ANGIOEDEMA 03/20/20   [provider]  mirtazapine (REMERON) 15 MG tablet TAKE ONE-HALF TABLET BY MOUTH AT BEDTIME -- MAY TAKE ONE FULL TABLETS (15MG )  IF 7.5 MG IS NOT EFFECTIVE 10/22/20   [provider]  QUEtiapine (SEROQUEL) 200 MG tablet TAKE ONE TABLET BY MOUTH AT BEDTIME FOR MENTAL HEALTH 03/20/20   [provider]  sertraline (ZOLOFT) 100 MG tablet TAKE ONE-HALF TABLET BY MOUTH DAILY FOR MENTAL HEALTH 10/22/20   [provider]    Current Outpatient Medications  Medication Sig Dispense Refill   diphenhydrAMINE (BENADRYL) 25 mg capsule Take by mouth.     famotidine  (PEPCID) 20 MG tablet TAKE ONE TABLET BY MOUTH TWICE A DAY FOR HIVES     montelukast (SINGULAIR) 10 MG tablet Take 1 tablet by mouth daily.     omeprazole (PRILOSEC) 40 MG capsule TAKE ONE CAPSULE BY MOUTH TWICE A DAY FOR ACID REFLUX     cetirizine (ZYRTEC) 10 MG tablet TAKE ONE TABLET BY MOUTH TWICE A DAY FOR URTICARIA AND ANGIOEDEMA     mirtazapine (REMERON) 15 MG tablet TAKE ONE-HALF TABLET BY MOUTH AT BEDTIME -- MAY TAKE ONE FULL TABLETS (15MG )  IF 7.5 MG IS NOT EFFECTIVE     QUEtiapine (SEROQUEL) 200 MG tablet TAKE ONE TABLET BY MOUTH AT BEDTIME FOR MENTAL HEALTH     sertraline (ZOLOFT) 100 MG tablet TAKE ONE-HALF TABLET BY MOUTH DAILY FOR MENTAL HEALTH     Current Facility-Administered Medications  Medication Dose Route Frequency Provider Last Rate Last Admin   0.9 %  sodium chloride infusion  500 mL Intravenous Once 12/22/20, MD        Allergies as of 11/28/2020 - Review Complete 11/28/2020  Allergen Reaction Noted   Cat hair extract Hives 10/22/2020   Dog epithelium allergy skin test Hives 10/22/2020   Other Other (See Comments) 10/22/2020    Family History  Problem Relation Age of Onset   Hypertension Father    Colon cancer Neg Hx  Colon polyps Neg Hx    Esophageal cancer Neg Hx    Rectal cancer Neg Hx    Stomach cancer Neg Hx     Social History   Socioeconomic History   Marital status: Legally Separated    Spouse name: Not on file   Number of children: Not on file   Years of education: Not on file   Highest education level: Not on file  Occupational History   Not on file  Tobacco Use   Smoking status: Every Day    Packs/day: 0.25    Years: 15.00    Pack years: 3.75    Types: Cigarettes   Smokeless tobacco: Never  Substance and Sexual Activity   Alcohol use: Yes    Comment: social   Drug use: Yes    Types: Marijuana, Opium   Sexual activity: Yes  Other Topics Concern   Not on file  Social History Narrative   Not on file   Social Determinants  of Health   Financial Resource Strain: Not on file  Food Insecurity: Not on file  Transportation Needs: Not on file  Physical Activity: Not on file  Stress: Not on file  Social Connections: Not on file  Intimate Partner Violence: Not on file    Review of Systems: Positive for recent jaw fracture - healing well All other review of systems negative except as mentioned in the HPI.  Physical Exam: Vital signs BP 115/77   Pulse 86   Temp 98.4 F (36.9 C)   Ht 5\' 9"  (1.753 m)   Wt 135 lb (61.2 kg)   SpO2 97%   BMI 19.94 kg/m   General:   Alert,  Well-developed, well-nourished, pleasant and cooperative in NAD Lungs:  Clear throughout to auscultation.   Heart:  Regular rate and rhythm; no murmurs, clicks, rubs,  or gallops. Abdomen:  Soft, nontender and nondistended. Normal bowel sounds.   Neuro/Psych:  Alert and cooperative. Normal mood and affect. A and O x 3   @[name]  , MD, [facility] [phone] (pager) 11/28/2020 9:30 AM@

## 2020-11-28 NOTE — Patient Instructions (Addendum)
There was some slight color change in the stomach that might represent inflammation or infection.  I took biopsies and will let you and [facility] know.  I appreciate the opportunity to care for you. Iva Boop, MD, FACG   YOU HAD AN ENDOSCOPIC PROCEDURE TODAY AT THE [facility]:   Refer to the procedure report that was given to you for any specific questions about what was found during the examination.  If the procedure report does not answer your questions, please call your gastroenterologist to clarify.  If you requested that your care partner not be given the details of your procedure findings, then the procedure report has been included in a sealed envelope for you to review at your convenience later.  YOU SHOULD EXPECT: Some feelings of bloating in the abdomen. Passage of more gas than usual.  Walking can help get rid of the air that was put into your GI tract during the procedure and reduce the bloating. If you had a lower endoscopy (such as a colonoscopy or flexible sigmoidoscopy) you may notice spotting of blood in your stool or on the toilet paper. If you underwent a bowel prep for your procedure, you may not have a normal bowel movement for a few days.  Please Note:  You might notice some irritation and congestion in your nose or some drainage.  This is from the oxygen used during your procedure.  There is no need for concern and it should clear up in a day or so.  SYMPTOMS TO REPORT IMMEDIATELY:  Following upper endoscopy (EGD)  Vomiting of blood or coffee ground material  New chest pain or pain under the shoulder blades  Painful or persistently difficult swallowing  New shortness of breath  Fever of 100F or higher  Black, tarry-looking stools  For urgent or emergent issues, a gastroenterologist can be reached at any hour by calling [phone]. Do not use MyChart messaging for urgent concerns.    DIET:  We do recommend a small meal at first, but then you may  proceed to your regular diet.  Drink plenty of fluids but you should avoid alcoholic beverages for 24 hours.  ACTIVITY:  You should plan to take it easy for the rest of today and you should NOT DRIVE or use heavy machinery until tomorrow (because of the sedation medicines used during the test).    FOLLOW UP: Our staff will call the number listed on your records 48-72 hours following your procedure to check on you and address any questions or concerns that you may have regarding the information given to you following your procedure. If we do not reach you, we will leave a message.  We will attempt to reach you two times.  During this call, we will ask if you have developed any symptoms of COVID 19. If you develop any symptoms (ie: fever, flu-like symptoms, shortness of breath, cough etc.) before then, please call [phone].  If you test positive for Covid 19 in the 2 weeks post procedure, please call and report this information to [place].    If any biopsies were taken you will be contacted by phone or by letter within the next 1-3 weeks.  Please call us at [phone] if you have not heard about the biopsies in 3 weeks.    SIGNATURES/CONFIDENTIALITY: You and/or your care partner have signed paperwork which will be entered into your electronic medical record.  These signatures attest to the fact that that the information  above on your After Visit Summary has been reviewed and is understood.  Full responsibility of the confidentiality of this discharge information lies with you and/or your care-partner.  

## 2020-12-02 ENCOUNTER — Telehealth: Payer: Self-pay

## 2020-12-02 ENCOUNTER — Telehealth: Payer: Self-pay | Admitting: *Deleted

## 2020-12-02 NOTE — Telephone Encounter (Signed)
Left message on follow up call. 

## 2020-12-02 NOTE — Telephone Encounter (Signed)
  Follow up Call-  Call back number 11/28/2020  Post procedure Call Back phone  # [phone]  Permission to leave phone message Yes  Some recent data might be hidden     Patient questions:  Do you have a fever, pain , or abdominal swelling? No. Pain Score  0 *  Have you tolerated food without any problems? Yes.    Have you been able to return to your normal activities? Yes.    Do you have any questions about your discharge instructions: Diet   No. Medications  No. Follow up visit  No.  Do you have questions or concerns about your Care? No.  Actions: * If pain score is 4 or above: No action needed, pain <4.  Have you developed a fever since your procedure? no  2.   Have you had an respiratory symptoms (SOB or cough) since your procedure? no  3.   Have you tested positive for COVID 19 since your procedure no  4.   Have you had any family members/close contacts diagnosed with the COVID 19 since your procedure?  no   If yes to any of these questions please route to Laverna Peace, RN and Karlton Lemon, RN

## 2020-12-06 ENCOUNTER — Encounter: Payer: Self-pay | Admitting: Internal Medicine

## 2021-11-11 ENCOUNTER — Emergency Department (HOSPITAL_COMMUNITY): Payer: No Typology Code available for payment source

## 2021-11-11 ENCOUNTER — Emergency Department (HOSPITAL_COMMUNITY)
Admission: EM | Admit: 2021-11-11 | Discharge: 2021-11-12 | Disposition: A | Discharge status: Home / Self Care | Payer: No Typology Code available for payment source | Attending: Emergency Medicine | Admitting: Emergency Medicine

## 2021-11-11 ENCOUNTER — Other Ambulatory Visit: Payer: Self-pay

## 2021-11-11 ENCOUNTER — Encounter (HOSPITAL_COMMUNITY): Payer: Self-pay

## 2021-11-11 DIAGNOSIS — K529 Noninfective gastroenteritis and colitis, unspecified: Secondary | ICD-10-CM | POA: Diagnosis not present

## 2021-11-11 DIAGNOSIS — R1013 Epigastric pain: Secondary | ICD-10-CM | POA: Diagnosis present

## 2021-11-11 DIAGNOSIS — R748 Abnormal levels of other serum enzymes: Secondary | ICD-10-CM | POA: Diagnosis not present

## 2021-11-11 DIAGNOSIS — Z20822 Contact with and (suspected) exposure to covid-19: Secondary | ICD-10-CM | POA: Insufficient documentation

## 2021-11-11 DIAGNOSIS — D72829 Elevated white blood cell count, unspecified: Secondary | ICD-10-CM | POA: Insufficient documentation

## 2021-11-11 LAB — COMPREHENSIVE METABOLIC PANEL
ALT: 15 U/L (ref 0–44)
AST: 22 U/L (ref 15–41)
Albumin: 4.1 g/dL (ref 3.5–5.0)
Alkaline Phosphatase: 82 U/L (ref 38–126)
Anion gap: 9 (ref 5–15)
BUN: 9 mg/dL (ref 6–20)
CO2: 26 mmol/L (ref 22–32)
Calcium: 9.2 mg/dL (ref 8.9–10.3)
Chloride: 104 mmol/L (ref 98–111)
Creatinine, Ser: 0.93 mg/dL (ref 0.61–1.24)
GFR, Estimated: >60 mL/min (ref >60)
Glucose, Bld: 141 mg/dL — ABNORMAL HIGH (ref 70–99)
Potassium: 3.9 mmol/L (ref 3.5–5.1)
Sodium: 139 mmol/L (ref 135–145)
Total Bilirubin: 0.6 mg/dL (ref 0.3–1.2)
Total Protein: 8.5 g/dL — ABNORMAL HIGH (ref 6.5–8.1)

## 2021-11-11 LAB — I-STAT CHEM 8, ED
BUN: 8 mg/dL (ref 6–20)
Calcium, Ion: 1.17 mmol/L (ref 1.15–1.40)
Chloride: 103 mmol/L (ref 98–111)
Creatinine, Ser: 0.8 mg/dL (ref 0.61–1.24)
Glucose, Bld: 140 mg/dL — ABNORMAL HIGH (ref 70–99)
HCT: 45 % (ref 39.0–52.0)
Hemoglobin: 15.3 g/dL (ref 13.0–17.0)
Potassium: 4.1 mmol/L (ref 3.5–5.1)
Sodium: 140 mmol/L (ref 135–145)
TCO2: 24 mmol/L (ref 22–32)

## 2021-11-11 LAB — URINALYSIS, ROUTINE W REFLEX MICROSCOPIC
Bilirubin Urine: NEGATIVE
Glucose, UA: NEGATIVE mg/dL
Hgb urine dipstick: NEGATIVE
Ketones, ur: 5 mg/dL — AB
Leukocytes,Ua: NEGATIVE
Nitrite: NEGATIVE
Protein, ur: 30 mg/dL — AB
Specific Gravity, Urine: 1.024 (ref 1.005–1.030)
pH: 6 (ref 5.0–8.0)

## 2021-11-11 LAB — CBC WITH DIFFERENTIAL/PLATELET
Abs Immature Granulocytes: 0.08 10*3/uL — ABNORMAL HIGH (ref 0.00–0.07)
Basophils Absolute: 0.1 10*3/uL (ref 0.0–0.1)
Basophils Relative: 0 %
Eosinophils Absolute: 0.2 10*3/uL (ref 0.0–0.5)
Eosinophils Relative: 1 %
HCT: 43.1 % (ref 39.0–52.0)
Hemoglobin: 14.2 g/dL (ref 13.0–17.0)
Immature Granulocytes: 1 %
Lymphocytes Relative: 16 %
Lymphs Abs: 2.3 10*3/uL (ref 0.7–4.0)
MCH: 32.6 pg (ref 26.0–34.0)
MCHC: 32.9 g/dL (ref 30.0–36.0)
MCV: 98.9 fL (ref 80.0–100.0)
Monocytes Absolute: 1 10*3/uL (ref 0.1–1.0)
Monocytes Relative: 7 %
Neutro Abs: 11 10*3/uL — ABNORMAL HIGH (ref 1.7–7.7)
Neutrophils Relative %: 75 %
Platelets: 536 10*3/uL — ABNORMAL HIGH (ref 150–400)
RBC: 4.36 MIL/uL (ref 4.22–5.81)
RDW: 13 % (ref 11.5–15.5)
WBC: 14.6 10*3/uL — ABNORMAL HIGH (ref 4.0–10.5)
nRBC: 0 % (ref 0.0–0.2)

## 2021-11-11 LAB — TROPONIN I (HIGH SENSITIVITY): Troponin I (High Sensitivity): 2 ng/L (ref <18)

## 2021-11-11 LAB — RESP PANEL BY RT-PCR (FLU A&B, COVID) ARPGX2
Influenza A by PCR: NEGATIVE
Influenza B by PCR: NEGATIVE
SARS Coronavirus 2 by RT PCR: NEGATIVE

## 2021-11-11 LAB — LIPASE, BLOOD: Lipase: 57 U/L — ABNORMAL HIGH (ref 11–51)

## 2021-11-11 LAB — ETHANOL: Alcohol, Ethyl (B): <10 mg/dL (ref <10)

## 2021-11-11 MED ORDER — FENTANYL CITRATE PF 50 MCG/ML IJ SOSY
50.0000 ug | PREFILLED_SYRINGE | Freq: Once | INTRAMUSCULAR | Status: AC
  Administered 2021-11-11: 50 ug via INTRAVENOUS
  Filled 2021-11-11: qty 1

## 2021-11-11 MED ORDER — ONDANSETRON 4 MG PO TBDP: 4.0000 mg | ORAL_TABLET | Freq: Three times a day (TID) | ORAL | 0 refills | Status: DC | PRN

## 2021-11-11 MED ORDER — IOHEXOL 300 MG/ML  SOLN
100.0000 mL | Freq: Once | INTRAMUSCULAR | Status: AC | PRN
  Administered 2021-11-11: 100 mL via INTRAVENOUS

## 2021-11-11 MED ORDER — LACTATED RINGERS IV BOLUS
1000.0000 mL | Freq: Once | INTRAVENOUS | Status: AC
  Administered 2021-11-11: 1000 mL via INTRAVENOUS

## 2021-11-11 MED ORDER — ONDANSETRON 4 MG PO TBDP: 4.0000 mg | ORAL_TABLET | Freq: Once | ORAL | Status: DC

## 2021-11-11 MED ORDER — ONDANSETRON HCL 4 MG/2ML IJ SOLN
4.0000 mg | Freq: Once | INTRAMUSCULAR | Status: AC
Start: 2021-11-11 — End: 2021-11-11
  Administered 2021-11-11: 4 mg via INTRAVENOUS
  Filled 2021-11-11: qty 2

## 2021-11-11 NOTE — ED Notes (Signed)
CT needs Iv for scan please

## 2021-11-11 NOTE — ED Provider Notes (Signed)
[facility]-EMERGENCY DEPT Provider Note   CSN: [id number] Arrival date & time: 11/11/21  1550     History {Add pertinent medical, surgical, social history, OB history to HPI:1} Chief Complaint  Patient presents with   Abdominal Pain   Nausea   Emesis   Chills    Darrell Sanders is a 36 y.o. male.   Abdominal Pain Associated symptoms: vomiting   Emesis Associated symptoms: abdominal pain        Home Medications Prior to Admission medications   Medication Sig Start Date End Date Taking? Authorizing Provider  cetirizine (ZYRTEC) 10 MG tablet TAKE ONE TABLET BY MOUTH TWICE A DAY FOR URTICARIA AND ANGIOEDEMA 03/20/20   [provider]  diphenhydrAMINE (BENADRYL) 25 mg capsule Take by mouth.    [provider]  famotidine (PEPCID) 20 MG tablet TAKE ONE TABLET BY MOUTH TWICE A DAY FOR HIVES 03/20/20   [provider]  mirtazapine (REMERON) 15 MG tablet TAKE ONE-HALF TABLET BY MOUTH AT BEDTIME -- MAY TAKE ONE FULL TABLETS (15MG )  IF 7.5 MG IS NOT EFFECTIVE 10/22/20   [provider]  montelukast (SINGULAIR) 10 MG tablet Take 1 tablet by mouth daily. 03/20/20   [provider]  omeprazole (PRILOSEC) 40 MG capsule TAKE ONE CAPSULE BY MOUTH TWICE A DAY FOR ACID REFLUX 05/08/20   [provider]  QUEtiapine (SEROQUEL) 200 MG tablet TAKE ONE TABLET BY MOUTH AT BEDTIME FOR MENTAL HEALTH 03/20/20   [provider]  sertraline (ZOLOFT) 100 MG tablet TAKE ONE-HALF TABLET BY MOUTH DAILY FOR MENTAL HEALTH 10/22/20   [provider]      Allergies    Cat hair extract, Dog epithelium allergy skin test, and Other    Review of Systems   Review of Systems  Gastrointestinal:  Positive for abdominal pain and vomiting.    Physical Exam Updated Vital Signs BP (!) 154/101   Pulse 64   Temp 97.6 F (36.4 C) (Oral)   Resp 13   Ht 5\' 9"  (1.753 m)   Wt 61.2 kg   SpO2 97%   BMI 19.94 kg/m  Physical  Exam  ED Results / Procedures / Treatments   Labs (all labs ordered are listed, but only abnormal results are displayed) Labs Reviewed  LIPASE, BLOOD - Abnormal; Notable for the following components:      Result Value   Lipase 57 (*)    All other components within normal limits  CBC WITH DIFFERENTIAL/PLATELET - Abnormal; Notable for the following components:   WBC 14.6 (*)    Platelets 536 (*)    Neutro Abs 11.0 (*)    Abs Immature Granulocytes 0.08 (*)    All other components within normal limits  COMPREHENSIVE METABOLIC PANEL - Abnormal; Notable for the following components:   Glucose, Bld 141 (*)    Total Protein 8.5 (*)    All other components within normal limits  I-STAT CHEM 8, ED - Abnormal; Notable for the following components:   Glucose, Bld 140 (*)    All other components within normal limits  RESP PANEL BY RT-PCR (FLU A&B, COVID) ARPGX2  ETHANOL  URINALYSIS, ROUTINE W REFLEX MICROSCOPIC  TROPONIN I (HIGH SENSITIVITY)  TROPONIN I (HIGH SENSITIVITY)    EKG None  Radiology DG Chest 2 View  Result Date: 11/11/2021 CLINICAL DATA:  Epigastric pain EXAM: CHEST - 2 VIEW COMPARISON:  None Available. FINDINGS: Normal mediastinum and cardiac silhouette. Normal pulmonary vasculature. No evidence of effusion, infiltrate, or  pneumothorax. No acute bony abnormality. IMPRESSION: No acute cardiopulmonary process.  Normal chest radiograph. Electronically Signed   By: Genevive Bi M.D.   On: 11/11/2021 17:32    Procedures Procedures  {Document cardiac monitor, telemetry assessment procedure when appropriate:1}  Medications Ordered in ED Medications  ondansetron (ZOFRAN-ODT) disintegrating tablet 4 mg (has no administration in time range)    ED Course/ Medical Decision Making/ A&P                           Medical Decision Making  ***  {Document critical care time when appropriate:1} {Document review of labs and clinical decision tools ie heart score, Chads2Vasc2  etc:1}  {Document your independent review of radiology images, and any outside records:1} {Document your discussion with family members, caretakers, and with consultants:1} {Document social determinants of health affecting pt's care:1} {Document your decision making why or why not admission, treatments were needed:1} Final Clinical Impression(s) / ED Diagnoses Final diagnoses:  None    Rx / DC Orders ED Discharge Orders     None

## 2021-11-11 NOTE — ED Notes (Signed)
Bladder scan showed 53mL's of urine present.

## 2021-11-11 NOTE — Discharge Instructions (Addendum)
Recommend you follow-up outpatient with your PCP. Continue to push oral fluids and your symptoms should resolve over the next few days.

## 2021-11-11 NOTE — ED Notes (Signed)
Asked pt if he could urinate, he stated that he has already attempted to urinate 3x times without any success.

## 2021-11-11 NOTE — ED Triage Notes (Signed)
Pt arrives via EMS. Pt reports abdominal pain, nausea, vomiting, and chills that started today.

## 2021-11-11 NOTE — ED Provider Triage Note (Signed)
Emergency Medicine Provider Triage Evaluation Note  Read Bonelli , a 36 y.o. male  was evaluated in triage.  Pt complains of NVD x 2 hours with chills and epigastric pain. H/o this and takes omeprazole, but reports this is the worse it has been. .  Review of Systems  Positive:  Negative:   Physical Exam  There were no vitals taken for this visit. Gen:   Awake, no distress   Resp:  Normal effort  MSK:   Moves extremities without difficulty  Other:  Diffuse abdominal pain with guarding.   Medical Decision Making  Medically screening exam initiated at 3:55 PM.  Appropriate orders placed.  Bradly Sangiovanni was informed that the remainder of the evaluation will be completed by another provider, this initial triage assessment does not replace that evaluation, and the importance of remaining in the ED until their evaluation is complete.  Labs and imaging ordered.    Achille Rich, [place] 11/11/21 1559

## 2021-11-12 LAB — TROPONIN I (HIGH SENSITIVITY): Troponin I (High Sensitivity): 8 ng/L (ref <18)

## 2022-12-14 ENCOUNTER — Encounter (HOSPITAL_BASED_OUTPATIENT_CLINIC_OR_DEPARTMENT_OTHER): Payer: Self-pay | Admitting: Emergency Medicine

## 2022-12-14 ENCOUNTER — Other Ambulatory Visit (HOSPITAL_BASED_OUTPATIENT_CLINIC_OR_DEPARTMENT_OTHER): Payer: Self-pay

## 2022-12-14 ENCOUNTER — Emergency Department (HOSPITAL_BASED_OUTPATIENT_CLINIC_OR_DEPARTMENT_OTHER)
Admission: EM | Admit: 2022-12-14 | Discharge: 2022-12-14 | Disposition: A | Discharge status: Home / Self Care | Payer: No Typology Code available for payment source | Attending: Emergency Medicine | Admitting: Emergency Medicine

## 2022-12-14 ENCOUNTER — Telehealth (HOSPITAL_BASED_OUTPATIENT_CLINIC_OR_DEPARTMENT_OTHER): Payer: Self-pay | Admitting: Physician Assistant

## 2022-12-14 ENCOUNTER — Other Ambulatory Visit: Payer: Self-pay

## 2022-12-14 DIAGNOSIS — A09 Infectious gastroenteritis and colitis, unspecified: Secondary | ICD-10-CM

## 2022-12-14 DIAGNOSIS — R197 Diarrhea, unspecified: Secondary | ICD-10-CM | POA: Diagnosis present

## 2022-12-14 LAB — COMPREHENSIVE METABOLIC PANEL
ALT: 16 U/L (ref 0–44)
AST: 26 U/L (ref 15–41)
Albumin: 3.9 g/dL (ref 3.5–5.0)
Alkaline Phosphatase: 71 U/L (ref 38–126)
Anion gap: 12 (ref 5–15)
BUN: 9 mg/dL (ref 6–20)
CO2: 25 mmol/L (ref 22–32)
Calcium: 9.1 mg/dL (ref 8.9–10.3)
Chloride: 100 mmol/L (ref 98–111)
Creatinine, Ser: 0.82 mg/dL (ref 0.61–1.24)
GFR, Estimated: >60 mL/min (ref >60)
Glucose, Bld: 109 mg/dL — ABNORMAL HIGH (ref 70–99)
Potassium: 3.7 mmol/L (ref 3.5–5.1)
Sodium: 137 mmol/L (ref 135–145)
Total Bilirubin: 0.8 mg/dL (ref 0.3–1.2)
Total Protein: 7.3 g/dL (ref 6.5–8.1)

## 2022-12-14 LAB — CBC
HCT: 38 % — ABNORMAL LOW (ref 39.0–52.0)
Hemoglobin: 12.6 g/dL — ABNORMAL LOW (ref 13.0–17.0)
MCH: 34.3 pg — ABNORMAL HIGH (ref 26.0–34.0)
MCHC: 33.2 g/dL (ref 30.0–36.0)
MCV: 103.5 fL — ABNORMAL HIGH (ref 80.0–100.0)
Platelets: 301 10*3/uL (ref 150–400)
RBC: 3.67 MIL/uL — ABNORMAL LOW (ref 4.22–5.81)
RDW: 13.9 % (ref 11.5–15.5)
WBC: 7.7 10*3/uL (ref 4.0–10.5)
nRBC: 0 % (ref 0.0–0.2)

## 2022-12-14 LAB — LIPASE, BLOOD: Lipase: 43 U/L (ref 11–51)

## 2022-12-14 LAB — URINALYSIS, ROUTINE W REFLEX MICROSCOPIC
Bilirubin Urine: NEGATIVE
Glucose, UA: NEGATIVE mg/dL
Hgb urine dipstick: NEGATIVE
Ketones, ur: NEGATIVE mg/dL
Leukocytes,Ua: NEGATIVE
Nitrite: NEGATIVE
Protein, ur: NEGATIVE mg/dL
Specific Gravity, Urine: 1.025 (ref 1.005–1.030)
pH: 6 (ref 5.0–8.0)

## 2022-12-14 LAB — C DIFFICILE QUICK SCREEN W PCR REFLEX
C Diff antigen: NEGATIVE
C Diff interpretation: NOT DETECTED
C Diff toxin: NEGATIVE

## 2022-12-14 MED ORDER — HYOSCYAMINE SULFATE 0.125 MG SL SUBL
0.1250 mg | SUBLINGUAL_TABLET | SUBLINGUAL | 0 refills | Status: DC | PRN
  Filled 2022-12-14: qty 30, 5d supply, fill #0

## 2022-12-14 MED ORDER — HYOSCYAMINE SULFATE 0.125 MG SL SUBL: 0.1250 mg | SUBLINGUAL_TABLET | SUBLINGUAL | 0 refills | Status: AC | PRN

## 2022-12-14 MED ORDER — CIPROFLOXACIN HCL 500 MG PO TABS: 500.0000 mg | ORAL_TABLET | Freq: Two times a day (BID) | ORAL | 0 refills | Status: DC

## 2022-12-14 MED ORDER — CIPROFLOXACIN HCL 500 MG PO TABS
500.0000 mg | ORAL_TABLET | Freq: Two times a day (BID) | ORAL | 0 refills | Status: DC
  Filled 2022-12-14: qty 10, 5d supply, fill #0

## 2022-12-14 NOTE — ED Provider Notes (Signed)
[place] [facility] Provider Note   CSN: [id number] Arrival date & time: 12/14/22  1402     History  Chief Complaint  Patient presents with   Diarrhea    Darrell Sanders is a 37 y.o. male who traveled to [place] approximately 1 month ago.  Toward the end of his trip he started having loose stools.  His girlfriend had some loose stools at the time as well.  He states that the diarrhea resolved but he has had daily watery stools 5-6 times a day since then.  Takes daily omeprazole and has not taken any Imodium or other medications for diarrhea specifically. He denies abdominal pain.   Diarrhea      Home Medications Prior to Admission medications   Medication Sig Start Date End Date Taking? Authorizing Provider  cetirizine (ZYRTEC) 10 MG tablet TAKE ONE TABLET BY MOUTH TWICE A DAY FOR URTICARIA AND ANGIOEDEMA 03/20/20   [provider]  diphenhydrAMINE (BENADRYL) 25 mg capsule Take by mouth.    [provider]  famotidine (PEPCID) 20 MG tablet TAKE ONE TABLET BY MOUTH TWICE A DAY FOR HIVES 03/20/20   [provider]  mirtazapine (REMERON) 15 MG tablet TAKE ONE-HALF TABLET BY MOUTH AT BEDTIME -- MAY TAKE ONE FULL TABLETS (15MG )  IF 7.5 MG IS NOT EFFECTIVE 10/22/20   [provider]  montelukast (SINGULAIR) 10 MG tablet Take 1 tablet by mouth daily. 03/20/20   [provider]  omeprazole (PRILOSEC) 40 MG capsule TAKE ONE CAPSULE BY MOUTH TWICE A DAY FOR ACID REFLUX 05/08/20   [provider]  ondansetron (ZOFRAN-ODT) 4 MG disintegrating tablet Take 1 tablet (4 mg total) by mouth every 8 (eight) hours as needed for nausea or vomiting. 11/11/21   Ernie Avena, MD  QUEtiapine (SEROQUEL) 200 MG tablet TAKE ONE TABLET BY MOUTH AT BEDTIME FOR MENTAL HEALTH 03/20/20   [provider]  sertraline (ZOLOFT) 100 MG tablet TAKE ONE-HALF TABLET BY MOUTH DAILY FOR MENTAL HEALTH 10/22/20   [provider]      Allergies    Cat hair extract, Dog epithelium (canis lupus familiaris), and Other    Review of Systems   Review of Systems  Gastrointestinal:  Positive for diarrhea.    Physical Exam Updated Vital Signs BP (!) 137/103 (BP Location: Left Arm)   Pulse 89   Temp 98.4 F (36.9 C)   Resp 18   Ht 5\' 9"  (1.753 m)   Wt 61.2 kg   SpO2 100%   BMI 19.94 kg/m  Physical Exam Vitals and nursing note reviewed.  Constitutional:      General: He is not in acute distress.    Appearance: He is well-developed. He is not diaphoretic.  HENT:     Head: Normocephalic and atraumatic.  Eyes:     General: No scleral icterus.    Conjunctiva/sclera: Conjunctivae normal.  Cardiovascular:     Rate and Rhythm: Normal rate and regular rhythm.     Heart sounds: Normal heart sounds.  Pulmonary:     Effort: Pulmonary effort is normal. No respiratory distress.     Breath sounds: Normal breath sounds.  Abdominal:     Palpations: Abdomen is soft.     Tenderness: There is no abdominal tenderness.  Musculoskeletal:     Cervical back: Normal range of motion and neck supple.  Skin:    General: Skin is warm and dry.  Neurological:     Mental Status: He  is alert.  Psychiatric:        Behavior: Behavior normal.     ED Results / Procedures / Treatments   Labs (all labs ordered are listed, but only abnormal results are displayed) Labs Reviewed  COMPREHENSIVE METABOLIC PANEL - Abnormal; Notable for the following components:      Result Value   Glucose, Bld 109 (*)    All other components within normal limits  CBC - Abnormal; Notable for the following components:   RBC 3.67 (*)    Hemoglobin 12.6 (*)    HCT 38.0 (*)    MCV 103.5 (*)    MCH 34.3 (*)    All other components within normal limits  GASTROINTESTINAL PANEL BY PCR, STOOL (REPLACES STOOL CULTURE)  C DIFFICILE QUICK SCREEN W PCR REFLEX    LIPASE, BLOOD  URINALYSIS, ROUTINE W REFLEX MICROSCOPIC     EKG None  Radiology No results found.  Procedures Procedures    Medications Ordered in ED Medications - No data to display  ED Course/ Medical Decision Making/ A&P                                 Medical Decision Making Patient with 1 month of diarrhea after after traveling to [place].  His girlfriend also had a short course of diarrhea but his has been persistent.   The differential diagnosis of diarrhea includes but is not limited to Viral- norovirus/rotavirus; Bacterial-Campylobacter,Shigella, Salmonella, Escherichia coli, E. coli 0157:H7, Yersinia enterocolitica, Vibrio cholerae, Clostridium difficile. Parasitic- Giardia lamblia, Cryptosporidium,Entamoeba histolytica,Cyclospora, Microsporidium. Toxin- Staphylococcus aureus, Bacillus cereus. Noninfectious causes include GI Bleed, Appendicitis, Mesenteric Ischemia, Diverticulitis, Adrenal Crisis, Thyroid Storm, Toxicologic exposures, Antibiotic or drug-associated, inflammatory bowel disease.     Has not tried any medications.  He has a benign abdominal exam and no fevers or chills.  His stool pathogen panels are currently pending.  Will discharge on Cipro.  Success discussed outpatient follow-up and return precautions.  Amount and/or Complexity of Data Reviewed Labs: ordered.           Final Clinical Impression(s) / ED Diagnoses Final diagnoses:  None    Rx / DC Orders ED Discharge Orders     None         Arthor Captain, PA-C 12/14/22 1739    Charlynne Pander, MD 12/15/22 1501

## 2022-12-14 NOTE — Discharge Instructions (Addendum)
Your diarrhea pathogen panels are currently pending.  You do not have to wait in the emergency department for it. Discharging you with Cipro.  You can also try over-the-counter Imodium to help slow your stools.  It may also be helpful to use Metamucil which can help bind your stool and create stool volume instead of liquid. Contact a health care provider if: You have a fever. Your diarrhea gets worse. You have new symptoms. You vomit every time you eat or drink. You feel light-headed, dizzy, or have a headache. You have muscle cramps. You have signs of dehydration, such as: Dark urine, very little urine, or no urine. Cracked lips. Dry mouth. Sunken eyes. Sleepiness. Weakness. You have bloody or black stools or stools that look like tar. You have severe pain, cramping, or bloating in your abdomen. Your skin feels cold and clammy. You feel confused. Get help right away if: You have chest pain or your heart is beating very quickly. You have trouble breathing or you are breathing very quickly. You feel extremely weak or you faint. These symptoms may be an emergency. Get help right away. Call 911.

## 2022-12-14 NOTE — ED Triage Notes (Signed)
PT to ER states he took a trip to the [place] returning on 8/28.  States he has been having diarrhea since that time.  Denies abdominal pain.  Denies n/v.

## 2022-12-14 NOTE — Telephone Encounter (Cosign Needed)
Pharmacy change cipro to [facility]

## 2022-12-15 LAB — GASTROINTESTINAL PANEL BY PCR, STOOL (REPLACES STOOL CULTURE)

## 2023-02-08 ENCOUNTER — Emergency Department (HOSPITAL_COMMUNITY): Payer: No Typology Code available for payment source

## 2023-02-08 ENCOUNTER — Emergency Department (HOSPITAL_COMMUNITY)
Admission: EM | Admit: 2023-02-08 | Discharge: 2023-02-09 | Disposition: A | Discharge status: Other Acute Inpatient Hospital | Payer: No Typology Code available for payment source | Attending: Emergency Medicine | Admitting: Emergency Medicine

## 2023-02-08 DIAGNOSIS — Z20822 Contact with and (suspected) exposure to covid-19: Secondary | ICD-10-CM | POA: Diagnosis not present

## 2023-02-08 DIAGNOSIS — F1020 Alcohol dependence, uncomplicated: Secondary | ICD-10-CM | POA: Diagnosis present

## 2023-02-08 DIAGNOSIS — Y908 Blood alcohol level of 240 mg/100 ml or more: Secondary | ICD-10-CM | POA: Diagnosis not present

## 2023-02-08 DIAGNOSIS — S90415A Abrasion, left lesser toe(s), initial encounter: Secondary | ICD-10-CM | POA: Insufficient documentation

## 2023-02-08 DIAGNOSIS — F17210 Nicotine dependence, cigarettes, uncomplicated: Secondary | ICD-10-CM | POA: Diagnosis not present

## 2023-02-08 DIAGNOSIS — R45851 Suicidal ideations: Secondary | ICD-10-CM | POA: Insufficient documentation

## 2023-02-08 DIAGNOSIS — X58XXXA Exposure to other specified factors, initial encounter: Secondary | ICD-10-CM | POA: Diagnosis not present

## 2023-02-08 DIAGNOSIS — S99922A Unspecified injury of left foot, initial encounter: Secondary | ICD-10-CM | POA: Diagnosis present

## 2023-02-08 DIAGNOSIS — M79675 Pain in left toe(s): Secondary | ICD-10-CM

## 2023-02-08 LAB — COMPREHENSIVE METABOLIC PANEL
ALT: 22 U/L (ref 0–44)
AST: 42 U/L — ABNORMAL HIGH (ref 15–41)
Albumin: 3.5 g/dL (ref 3.5–5.0)
Alkaline Phosphatase: 63 U/L (ref 38–126)
Anion gap: 14 (ref 5–15)
BUN: 11 mg/dL (ref 6–20)
CO2: 24 mmol/L (ref 22–32)
Calcium: 8.4 mg/dL — ABNORMAL LOW (ref 8.9–10.3)
Chloride: 105 mmol/L (ref 98–111)
Creatinine, Ser: 0.78 mg/dL (ref 0.61–1.24)
GFR, Estimated: >60 mL/min (ref >60)
Glucose, Bld: 77 mg/dL (ref 70–99)
Potassium: 3.2 mmol/L — ABNORMAL LOW (ref 3.5–5.1)
Sodium: 143 mmol/L (ref 135–145)
Total Bilirubin: 0.5 mg/dL (ref <1.2)
Total Protein: 6.7 g/dL (ref 6.5–8.1)

## 2023-02-08 LAB — CBC
HCT: 37.2 % — ABNORMAL LOW (ref 39.0–52.0)
Hemoglobin: 12 g/dL — ABNORMAL LOW (ref 13.0–17.0)
MCH: 34.5 pg — ABNORMAL HIGH (ref 26.0–34.0)
MCHC: 32.3 g/dL (ref 30.0–36.0)
MCV: 106.9 fL — ABNORMAL HIGH (ref 80.0–100.0)
Platelets: 336 10*3/uL (ref 150–400)
RBC: 3.48 MIL/uL — ABNORMAL LOW (ref 4.22–5.81)
RDW: 13.8 % (ref 11.5–15.5)
WBC: 6.9 10*3/uL (ref 4.0–10.5)
nRBC: 0 % (ref 0.0–0.2)

## 2023-02-08 LAB — SALICYLATE LEVEL: Salicylate Lvl: <7 mg/dL — ABNORMAL LOW (ref 7.0–30.0)

## 2023-02-08 LAB — ACETAMINOPHEN LEVEL: Acetaminophen (Tylenol), Serum: <10 ug/mL — ABNORMAL LOW (ref 10–30)

## 2023-02-08 LAB — ETHANOL: Alcohol, Ethyl (B): 332 mg/dL (ref <10)

## 2023-02-08 MED ORDER — PANTOPRAZOLE SODIUM 40 MG PO TBEC
40.0000 mg | DELAYED_RELEASE_TABLET | Freq: Every day | ORAL | Status: DC
  Administered 2023-02-09: 40 mg via ORAL
  Filled 2023-02-08: qty 1

## 2023-02-08 MED ORDER — THIAMINE HCL 100 MG/ML IJ SOLN
100.0000 mg | Freq: Every day | INTRAMUSCULAR | Status: DC
Start: 2023-02-09 — End: 2023-02-09

## 2023-02-08 MED ORDER — MIRTAZAPINE 7.5 MG PO TABS
7.5000 mg | ORAL_TABLET | Freq: Every day | ORAL | Status: DC
  Administered 2023-02-08: 7.5 mg via ORAL
  Filled 2023-02-08: qty 1

## 2023-02-08 MED ORDER — LORAZEPAM 1 MG PO TABS: 0.0000 mg | ORAL_TABLET | Freq: Two times a day (BID) | ORAL | Status: DC

## 2023-02-08 MED ORDER — LORAZEPAM 2 MG/ML IJ SOLN: 0.0000 mg | Freq: Four times a day (QID) | INTRAMUSCULAR | Status: DC

## 2023-02-08 MED ORDER — LORAZEPAM 2 MG/ML IJ SOLN: 0.0000 mg | Freq: Two times a day (BID) | INTRAMUSCULAR | Status: DC

## 2023-02-08 MED ORDER — QUETIAPINE FUMARATE 100 MG PO TABS
200.0000 mg | ORAL_TABLET | Freq: Every day | ORAL | Status: DC
  Administered 2023-02-08: 200 mg via ORAL
  Filled 2023-02-08: qty 2

## 2023-02-08 MED ORDER — THIAMINE MONONITRATE 100 MG PO TABS
100.0000 mg | ORAL_TABLET | Freq: Every day | ORAL | Status: DC
Start: 2023-02-09 — End: 2023-02-09
  Administered 2023-02-09: 100 mg via ORAL
  Filled 2023-02-08: qty 1

## 2023-02-08 MED ORDER — SERTRALINE HCL 50 MG PO TABS
50.0000 mg | ORAL_TABLET | Freq: Every day | ORAL | Status: DC
  Administered 2023-02-09: 50 mg via ORAL
  Filled 2023-02-08: qty 1

## 2023-02-08 MED ORDER — LORAZEPAM 1 MG PO TABS: 0.0000 mg | ORAL_TABLET | Freq: Four times a day (QID) | ORAL | Status: DC

## 2023-02-08 MED ORDER — BACITRACIN ZINC 500 UNIT/GM EX OINT
TOPICAL_OINTMENT | Freq: Two times a day (BID) | CUTANEOUS | Status: DC
  Administered 2023-02-08: 1 via TOPICAL
  Filled 2023-02-08 (×2): qty 0.9

## 2023-02-08 NOTE — ED Notes (Addendum)
Pt has been dressed out, pt father is with him who will take his belongings home.

## 2023-02-08 NOTE — ED Triage Notes (Signed)
Pt reports is a alcoholic , and if he doesn't get help tonight he will be a danger to himself or others, pt reports he want to be accepted to the [place] to get help. Pt reports his last drink was less than 1 hour ago. Pt reports he drinks a fifth a day. Pt reports smoking weed less than an hour ago.

## 2023-02-08 NOTE — Discharge Instructions (Addendum)
Follow-up with orthopedics regarding left toe pain if not getting better.  No fracture found in your x-ray today.  I suspect the bone bruise or sprain.  Use postop shoe for comfort.

## 2023-02-08 NOTE — ED Provider Notes (Signed)
[place] EMERGENCY DEPARTMENT AT [facility] Provider Note   CSN: [id number] Arrival date & time: 02/08/23  1957     History  Chief Complaint  Patient presents with   Suicidal    Darrell Sanders is a 37 y.o. male.  Patient here with suicidal thoughts.  He admits alcohol and marijuana use.  He stubbed his left toe yesterday.  Denies any other trauma.  Admits to drinking alcohol and smoking weed prior to coming here today.  He is having suicidal thoughts but no specific plan.  He has a history of depression, reflux.  He denies any abdominal pain nausea vomiting diarrhea otherwise.  No chest pain or shortness of breath.  The history is provided by the patient.       Home Medications Prior to Admission medications   Medication Sig Start Date End Date Taking? Authorizing Provider  cetirizine (ZYRTEC) 10 MG tablet TAKE ONE TABLET BY MOUTH TWICE A DAY FOR URTICARIA AND ANGIOEDEMA 03/20/20   [provider]  ciprofloxacin (CIPRO) 500 MG tablet Take 1 tablet (500 mg total) by mouth every 12 (twelve) hours. 12/14/22   Arthor Captain, PA-C  ciprofloxacin (CIPRO) 500 MG tablet Take 1 tablet (500 mg total) by mouth every 12 (twelve) hours. 12/14/22   Elson Areas, PA-C  diphenhydrAMINE (BENADRYL) 25 mg capsule Take by mouth.    [provider]  famotidine (PEPCID) 20 MG tablet TAKE ONE TABLET BY MOUTH TWICE A DAY FOR HIVES 03/20/20   [provider]  hyoscyamine (LEVSIN/SL) 0.125 MG SL tablet Place 1 tablet (0.125 mg total) under the tongue every 4 (four) hours as needed (pain). Up to 1.25 mg daily 12/14/22   Arthor Captain, PA-C  mirtazapine (REMERON) 15 MG tablet TAKE ONE-HALF TABLET BY MOUTH AT BEDTIME -- MAY TAKE ONE FULL TABLETS (15MG )  IF 7.5 MG IS NOT EFFECTIVE 10/22/20   [provider]  montelukast (SINGULAIR) 10 MG tablet Take 1 tablet by mouth daily. 03/20/20   [provider]  omeprazole (PRILOSEC) 40 MG capsule TAKE ONE  CAPSULE BY MOUTH TWICE A DAY FOR ACID REFLUX 05/08/20   [provider]  ondansetron (ZOFRAN-ODT) 4 MG disintegrating tablet Take 1 tablet (4 mg total) by mouth every 8 (eight) hours as needed for nausea or vomiting. 11/11/21   Ernie Avena, MD  QUEtiapine (SEROQUEL) 200 MG tablet TAKE ONE TABLET BY MOUTH AT BEDTIME FOR MENTAL HEALTH 03/20/20   [provider]  sertraline (ZOLOFT) 100 MG tablet TAKE ONE-HALF TABLET BY MOUTH DAILY FOR MENTAL HEALTH 10/22/20   [provider]      Allergies    Cat hair extract, Dog epithelium (canis lupus familiaris), and Other    Review of Systems   Review of Systems  Physical Exam Updated Vital Signs BP (!) 153/112   Pulse 97   Temp 98.6 F (37 C) (Oral)   Resp 18   Ht 5\' 9"  (1.753 m)   Wt 63.5 kg   SpO2 100%   BMI 20.67 kg/m  Physical Exam Vitals and nursing note reviewed.  Constitutional:      General: He is not in acute distress.    Appearance: He is well-developed.  HENT:     Head: Normocephalic and atraumatic.     Nose: Nose normal.     Mouth/Throat:     Mouth: Mucous membranes are moist.  Eyes:     Conjunctiva/sclera: Conjunctivae normal.     Pupils: Pupils are equal, round, and reactive  to light.  Cardiovascular:     Rate and Rhythm: Normal rate and regular rhythm.     Heart sounds: No murmur heard. Pulmonary:     Effort: Pulmonary effort is normal. No respiratory distress.     Breath sounds: Normal breath sounds.  Abdominal:     Palpations: Abdomen is soft.     Tenderness: There is no abdominal tenderness.  Musculoskeletal:        General: Tenderness present. No swelling.     Cervical back: Neck supple.     Comments: Tenderness to his left big toe but no obvious deformity, abrasion to the tip of his left toe  Skin:    General: Skin is warm and dry.     Capillary Refill: Capillary refill takes less than 2 seconds.  Neurological:     General: No focal deficit present.     Mental Status: He is  alert.  Psychiatric:     Comments: He is calm but endorses SI.     ED Results / Procedures / Treatments   Labs (all labs ordered are listed, but only abnormal results are displayed) Labs Reviewed  COMPREHENSIVE METABOLIC PANEL - Abnormal; Notable for the following components:      Result Value   Potassium 3.2 (*)    Calcium 8.4 (*)    AST 42 (*)    All other components within normal limits  ETHANOL - Abnormal; Notable for the following components:   Alcohol, Ethyl (B) 332 (*)    All other components within normal limits  SALICYLATE LEVEL - Abnormal; Notable for the following components:   Salicylate Lvl <7.0 (*)    All other components within normal limits  ACETAMINOPHEN LEVEL - Abnormal; Notable for the following components:   Acetaminophen (Tylenol), Serum <10 (*)    All other components within normal limits  CBC - Abnormal; Notable for the following components:   RBC 3.48 (*)    Hemoglobin 12.0 (*)    HCT 37.2 (*)    MCV 106.9 (*)    MCH 34.5 (*)    All other components within normal limits  RAPID URINE DRUG SCREEN, HOSP PERFORMED    EKG None  Radiology DG Toe Great Left  Result Date: 02/08/2023 CLINICAL DATA:  Hit toe on bed post EXAM: LEFT GREAT TOE COMPARISON:  None Available. FINDINGS: There is no evidence of fracture or dislocation. There is no evidence of arthropathy or other focal bone abnormality. Soft tissues are unremarkable. IMPRESSION: Negative. Electronically Signed   By: Charlett Nose M.D.   On: 02/08/2023 23:03    Procedures Procedures    Medications Ordered in ED Medications  bacitracin ointment (1 Application Topical Given 02/08/23 2220)  mirtazapine (REMERON) tablet 7.5 mg (7.5 mg Oral Given 02/08/23 2219)  pantoprazole (PROTONIX) EC tablet 40 mg (has no administration in time range)  QUEtiapine (SEROQUEL) tablet 200 mg (200 mg Oral Given 02/08/23 2220)  sertraline (ZOLOFT) tablet 50 mg (has no administration in time range)    ED Course/  Medical Decision Making/ A&P                                 Medical Decision Making Amount and/or Complexity of Data Reviewed Labs: ordered. Radiology: ordered.  Risk OTC drugs. Prescription drug management.   Brandel Mesner is here for suicidal ideation.  He is here voluntarily.  Unremarkable vitals.  Physically he appears well.  Does have an  abrasion to the tip of his left toe.  Will get an x-ray to evaluate for fracture but suspect contusion or sprain.  Neurologically is intact otherwise.  Denies any falls otherwise.  He is not on any blood thinners.  He has a history of depression.  He is here voluntarily.  Does admit to alcohol and drug use prior to arrival today.  Overall will get medical clearance labs and have him evaluated by psychiatry.  Will do bacitracin ointment twice a day to abrasion on his left toe.  Alcohol level 332 but otherwise lab work is unremarkable per my review and interpretation.  Clinically sober.  Per my review of toe x-ray there is no fracture or malalignment.  Overall suspect contusion or sprain.  Will place him in a postop shoe for comfort.  He is medically cleared at this time.  Will have him evaluated by psychiatry.  This chart was dictated using voice recognition software.  Despite best efforts to proofread,  errors can occur which can change the documentation meaning.         Final Clinical Impression(s) / ED Diagnoses Final diagnoses:  Pain of toe of left foot  Suicidal ideation    Rx / DC Orders ED Discharge Orders     None         Virgina Norfolk, DO 02/08/23 2308

## 2023-02-09 DIAGNOSIS — F1020 Alcohol dependence, uncomplicated: Secondary | ICD-10-CM | POA: Diagnosis present

## 2023-02-09 LAB — RAPID URINE DRUG SCREEN, HOSP PERFORMED
Amphetamines: NOT DETECTED
Barbiturates: NOT DETECTED
Benzodiazepines: NOT DETECTED
Cocaine: NOT DETECTED
Opiates: NOT DETECTED
Tetrahydrocannabinol: POSITIVE — AB

## 2023-02-09 LAB — RESP PANEL BY RT-PCR (RSV, FLU A&B, COVID)  RVPGX2
Influenza A by PCR: NEGATIVE
Influenza B by PCR: NEGATIVE
Resp Syncytial Virus by PCR: NEGATIVE
SARS Coronavirus 2 by RT PCR: NEGATIVE

## 2023-02-09 NOTE — ED Notes (Signed)
Pt ate breakfast and put socks on. Pt ambulated to the bathroom. Cooperatively took all medication this morning.

## 2023-02-09 NOTE — Progress Notes (Signed)
CSW has reached out to [facility]. At this time they will review. CSW has faxed over all appropriate clinical documents including the negative COVID TEST. CSW will update when updates are available.    Darrell Kadance Mccuistion LCSW-A   02/09/2023 11:41 AM

## 2023-02-09 NOTE — ED Provider Notes (Addendum)
Patient accepted to [facility].  Patient awake, alert, ambulatory, amenable to transfer.   Gerhard Munch, MD 02/09/23 1608    Gerhard Munch, MD 02/09/23 [phone]

## 2023-02-09 NOTE — ED Notes (Signed)
Patient refused Post op Shoe at this time.

## 2023-02-09 NOTE — ED Notes (Signed)
Pt sitting up in bed, speaking in complete sentences. Pt is eating a breakfast sandwich.

## 2023-02-09 NOTE — ED Notes (Signed)
Patient discharged off unit to [facility] per provider. Patient alert, calm, cooperative, no s/s of distress. Patient discharge information and belongings given to [employer]. Patient ambulatory off unit, escorted by RN. Patient transported by [employer].

## 2023-02-09 NOTE — Progress Notes (Signed)
Pt has been accepted to [facility] today before 10 pm  Bed assignment: Report to [facility] ED   Pt meets inpatient criteria per: Dahlia Byes NP  Attending Physician will be. Heidi Dach MD   Report can be called to: [phone]   Pt can arrive anytime before 10 pm    Care Team Notified: Dahlia Byes NP, Jacquelynn Cree NT, Melene Plan DO, Beth Willis RN    [place] Osker Ayoub LCSW-A   02/09/2023 3:29 PM

## 2023-02-09 NOTE — Consult Note (Signed)
[facility] ED ASSESSMENT   Reason for Consult:  Psychiatry evaluation Referring Physician:  ER Physician  Patient Identification: Darrell Sanders MRN:  [id number] ED Chief Complaint: Alcohol use disorder, severe, dependence (HCC)  Diagnosis:  Principal Problem:   Alcohol use disorder, severe, dependence (HCC)   ED Assessment Time Calculation: Start Time: 1008 Stop Time: 1037 Total Time in Minutes (Assessment Completion): 29   Subjective:   Darrell Sanders is a 37 y.o. male patient admitted with previous hx of Bipolar disorder , Alcohol use disorder and Depression .came to the ER after drinking Alcohol asking for detox and rehab treatment.  Patient at the time sated that without care he will hurt himself and others.  HPI:  [name] 36 years old was seen and evaluated this morning for Alcohol intoxication with alcohol level of 332.  Patient is alert and oriented x5, Veteran of the [employer].  Patient admitted that he has been an alcoholic for years, and has had three rehab treatment but relapsed.  Last Rehab was three years ago and that he relapsed three months after treatment.  Patient also states that while in one of the rehabs in [place] he was diagnosed with Depression and Bipolar disorder.  Patient reports that after that hospitalization he finished the Medications given to him and since then have not seen a Psychiatrist of taken any medications.   Patient reports three DWI and he does not drive anymore.  He reports that AA meetings were helpful in the past but because he does not drive he stopped participating in the Meetings.  Patient reports that he drinks to just drink and he drinks 5th of Whisky daily.  He reports that Alcohol keeps him in a very good mood, laughing, making people around him happy.  He also reports when he drinks and anybody does something that triggers him he looses control.  Patient reports that when he is withdrawing from alcohol he shakes, sweats and his hands tremors.  He denies  Alcohol withdrawal seizures.  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  He has four siblings but they all are not close to each other.  Patient reports childhood stress/trauma of his mother leaving him with his dad when he was 50 years old.  He denies SI/HI/AVH.  Patient denies previous suicide attempts.  Past Psychiatric History:  hx of Bipolar disorder , Alcohol use disorder and Depression.  Three previous Rehab treatment for Alcohol.  Last was in [place] three years ago.  He has hx of three DWI.    Risk to Self or Others: Is the patient at risk to self? No Has the patient been a risk to self in the past 6 months? No Has the patient been a risk to self within the distant past? No Is the patient a risk to others? No Has the patient been a risk to others in the past 6 months? No Has the patient been a risk to others within the distant past? No  [place] Scale:  Flowsheet Row ED from 02/08/2023 in [facility] Emergency Department at [facility] ED from 12/14/2022 in [facility] Emergency Department at [facility] ED from 11/11/2021 in [facility] Emergency Department at [facility]  C-SSRS RISK CATEGORY High Risk No Risk No Risk       AIMS:  , , ,  ,   ASAM:    Substance Abuse:     Past Medical History:  Past Medical History:  Diagnosis Date   Allergy    Arthritis    knee and back, hip   Closed jaw fracture (HCC) 07/2020   Depression    GERD (gastroesophageal reflux disease)    Pneumonia 02/21/2015    Past Surgical History:  Procedure Laterality Date   COLONOSCOPY     HIP SURGERY     Bone spurs removed   KNEE SURGERY     L arthorscopy   Family History:  Family History  Problem Relation Age of Onset   Hypertension Father    Colon cancer Neg Hx    Colon polyps Neg Hx    Esophageal cancer Neg Hx    Rectal cancer Neg Hx    Stomach cancer Neg Hx    Family Psychiatric  History: Father was a heavy Alcoholic he says and he  witnessed him drinking a lot growing up Social History:  Social History   Substance and Sexual Activity  Alcohol Use Yes   Comment: social     Social History   Substance and Sexual Activity  Drug Use Yes   Types: Marijuana, Opium    Social History   Socioeconomic History   Marital status: Legally Separated    Spouse name: Not on file   Number of children: Not on file   Years of education: Not on file   Highest education level: Not on file  Occupational History   Not on file  Tobacco Use   Smoking status: Every Day    Current packs/day: 0.25    Average packs/day: 0.3 packs/day for 15.0 years (3.8 ttl pk-yrs)    Types: Cigarettes   Smokeless tobacco: Never  Substance and Sexual Activity   Alcohol use: Yes    Comment: social   Drug use: Yes    Types: Marijuana, Opium   Sexual activity: Yes  Other Topics Concern   Not on file  Social History Narrative   Not on file   Social Determinants of Health   Financial Resource Strain: Not on file  Food Insecurity: Not on file  Transportation Needs: Not on file  Physical Activity: Not on file  Stress: Not on file  Social Connections: Not on file   Additional Social History:    Allergies:   Allergies  Allergen Reactions   Cat Hair Extract Hives   Dog Epithelium (Canis Lupus Familiaris) Hives   Other Other (See Comments)    Elm, cottonwood, and rough pigweed- reactive on allergy test    Labs:  Results for orders placed or performed during the hospital encounter of 02/08/23 (from the past 48 hour(s))  Comprehensive metabolic panel     Status: Abnormal   Collection Time: 02/08/23  9:10 PM  Result Value Ref Range   Sodium 143 135 - 145 mmol/L   Potassium 3.2 (L) 3.5 - 5.1 mmol/L   Chloride 105 98 - 111 mmol/L   CO2 24 22 - 32 mmol/L   Glucose, Bld 77 70 - 99 mg/dL    Comment: Glucose reference range applies only to samples taken after fasting for at least 8 hours.   BUN 11 6 - 20 mg/dL   Creatinine, Ser 2.95 0.61  - 1.24 mg/dL   Calcium 8.4 (L) 8.9 - 10.3 mg/dL   Total Protein 6.7 6.5 - 8.1 g/dL   Albumin 3.5 3.5 - 5.0 g/dL   AST 42 (H) 15 - 41 U/L   ALT 22 0 - 44 U/L   Alkaline Phosphatase 63 38 - 126 U/L  Total Bilirubin 0.5 <1.2 mg/dL   GFR, Estimated >78 >29 mL/min    Comment: (NOTE) Calculated using the CKD-EPI Creatinine Equation (2021)    Anion gap 14 5 - 15    Comment: Performed at [facility], [address], [place], [place] [address]  cbc     Status: Abnormal   Collection Time: 02/08/23  9:10 PM  Result Value Ref Range   WBC 6.9 4.0 - 10.5 K/uL   RBC 3.48 (L) 4.22 - 5.81 MIL/uL   Hemoglobin 12.0 (L) 13.0 - 17.0 g/dL   HCT 08.6 (L) 57.8 - 46.9 %   MCV 106.9 (H) 80.0 - 100.0 fL   MCH 34.5 (H) 26.0 - 34.0 pg   MCHC 32.3 30.0 - 36.0 g/dL   RDW 62.9 52.8 - 41.3 %   Platelets 336 150 - 400 K/uL   nRBC 0.0 0.0 - 0.2 %    Comment: Performed at [facility], [address], [place], [place] [address]  Ethanol     Status: Abnormal   Collection Time: 02/08/23  9:11 PM  Result Value Ref Range   Alcohol, Ethyl (B) 332 (HH) <10 mg/dL    Comment: CRITICAL RESULT CALLED TO, READ BACK BY AND VERIFIED WITH RIMOVA, J. RN AT 2150 ON 02/08/23. FA (NOTE) Lowest detectable limit for serum alcohol is 10 mg/dL.  For medical purposes only. Performed at [facility], [address] [place], [place] [address]   Salicylate level     Status: Abnormal   Collection Time: 02/08/23  9:11 PM  Result Value Ref Range   Salicylate Lvl <7.0 (L) 7.0 - 30.0 mg/dL    Comment: Performed at [facility], [address] [place], [place] [address]  Acetaminophen level     Status: Abnormal   Collection Time: 02/08/23  9:11 PM  Result Value Ref Range   Acetaminophen (Tylenol), Serum <10 (L) 10 - 30 ug/mL    Comment: (NOTE) Therapeutic concentrations vary significantly. A range of 10-30 ug/mL  may be an effective concentration for  many patients. However, some  are best treated at concentrations outside of this range. Acetaminophen concentrations >150 ug/mL at 4 hours after ingestion  and >50 ug/mL at 12 hours after ingestion are often associated with  toxic reactions.  Performed at [facility], [address], [place], [place] [address]   Rapid urine drug screen (hospital performed)     Status: Abnormal   Collection Time: 02/09/23  9:00 AM  Result Value Ref Range   Opiates NONE DETECTED NONE DETECTED   Cocaine NONE DETECTED NONE DETECTED   Benzodiazepines NONE DETECTED NONE DETECTED   Amphetamines NONE DETECTED NONE DETECTED   Tetrahydrocannabinol POSITIVE (A) NONE DETECTED   Barbiturates NONE DETECTED NONE DETECTED    Comment: (NOTE) DRUG SCREEN FOR MEDICAL PURPOSES ONLY.  IF CONFIRMATION IS NEEDED FOR ANY PURPOSE, NOTIFY LAB WITHIN 5 DAYS.  LOWEST DETECTABLE LIMITS FOR URINE DRUG SCREEN Drug Class                     Cutoff (ng/mL) Amphetamine and metabolites    1000 Barbiturate and metabolites    200 Benzodiazepine                 200 Opiates and metabolites        300 Cocaine and metabolites        300 THC  50 Performed at [facility], [address], [place], [place] [address]     Current Facility-Administered Medications  Medication Dose Route Frequency Provider Last Rate Last Admin   bacitracin ointment   Topical BID Virgina Norfolk, DO   1 Application at 02/08/23 2220   LORazepam (ATIVAN) injection 0-4 mg  0-4 mg Intravenous Q6H Curatolo, Adam, DO       Or   LORazepam (ATIVAN) tablet 0-4 mg  0-4 mg Oral Q6H Curatolo, Adam, DO       [START ON 02/11/2023] LORazepam (ATIVAN) injection 0-4 mg  0-4 mg Intravenous Q12H Curatolo, Adam, DO       Or   [START ON 02/11/2023] LORazepam (ATIVAN) tablet 0-4 mg  0-4 mg Oral Q12H Curatolo, Adam, DO       mirtazapine (REMERON) tablet 7.5 mg  7.5 mg Oral QHS Curatolo, Adam, DO   7.5 mg at  02/08/23 2219   pantoprazole (PROTONIX) EC tablet 40 mg  40 mg Oral Daily Curatolo, Adam, DO   40 mg at 02/09/23 0910   QUEtiapine (SEROQUEL) tablet 200 mg  200 mg Oral QHS Curatolo, Adam, DO   200 mg at 02/08/23 2220   sertraline (ZOLOFT) tablet 50 mg  50 mg Oral Daily Curatolo, Adam, DO   50 mg at 02/09/23 0981   thiamine (VITAMIN B1) tablet 100 mg  100 mg Oral Daily Curatolo, Adam, DO   100 mg at 02/09/23 1914   Or   thiamine (VITAMIN B1) injection 100 mg  100 mg Intravenous Daily Virgina Norfolk, DO       Current Outpatient Medications  Medication Sig Dispense Refill   bismuth subsalicylate (PEPTO BISMOL) 262 MG/15ML suspension Take 30 mLs by mouth every 6 (six) hours as needed for indigestion or diarrhea or loose stools.     cetirizine (ZYRTEC) 10 MG tablet Take 10 mg by mouth 2 (two) times daily.     fexofenadine (ALLEGRA) 180 MG tablet Take 180 mg by mouth daily.     hyoscyamine (LEVSIN/SL) 0.125 MG SL tablet Place 1 tablet (0.125 mg total) under the tongue every 4 (four) hours as needed (pain). Up to 1.25 mg daily 30 tablet 0   omeprazole (PRILOSEC) 40 MG capsule Take 40 mg by mouth daily.     sertraline (ZOLOFT) 100 MG tablet Take 100 mg by mouth daily.     QUEtiapine (SEROQUEL) 200 MG tablet TAKE ONE TABLET BY MOUTH AT BEDTIME FOR MENTAL HEALTH (Patient not taking: Reported on 02/09/2023)      Musculoskeletal: Strength & Muscle Tone: within normal limits Gait & Station: normal Patient leans: Front   Psychiatric Specialty Exam: Presentation  General Appearance:  Casual  Eye Contact: Good  Speech: Clear and Coherent; Normal Rate  Speech Volume: Normal  Handedness: Right   Mood and Affect  Mood: Anxious; Depressed  Affect: Congruent; Depressed   Thought Process  Thought Processes: Coherent; Goal Directed  Descriptions of Associations:Intact  Orientation:Full (Time, Place and Person)  Thought Content:Logical  History of Schizophrenia/Schizoaffective  disorder:No data recorded Duration of Psychotic Symptoms:No data recorded Hallucinations:Hallucinations: None  Ideas of Reference:None  Suicidal Thoughts:Suicidal Thoughts: No  Homicidal Thoughts:Homicidal Thoughts: No   Sensorium  Memory: Immediate Good; Recent Good; Remote Good  Judgment: Intact  Insight: Present   Executive Functions  Concentration: Good  Attention Span: Good  Recall: Good  Fund of Knowledge: Good  Language: Good   Psychomotor Activity  Psychomotor Activity: Psychomotor Activity: Normal   Assets  Assets: Communication  Skills; Desire for Improvement; Housing; Physical Health    Sleep  Sleep: Sleep: Fair   Physical Exam: Physical Exam Vitals and nursing note reviewed.  Constitutional:      Appearance: Normal appearance.  HENT:     Nose: Nose normal.  Cardiovascular:     Rate and Rhythm: Normal rate and regular rhythm.  Pulmonary:     Effort: Pulmonary effort is normal.  Musculoskeletal:        General: Normal range of motion.  Skin:    General: Skin is dry.  Neurological:     Mental Status: He is alert and oriented to person, place, and time.  Psychiatric:        Attention and Perception: Attention and perception normal.        Mood and Affect: Mood normal.        Speech: Speech normal.        Behavior: Behavior normal. Behavior is cooperative.        Thought Content: Thought content normal.        Judgment: Judgment is impulsive.    Review of Systems  Constitutional: Negative.   HENT: Negative.    Eyes: Negative.   Respiratory: Negative.    Cardiovascular: Negative.   Gastrointestinal: Negative.   Genitourinary: Negative.   Musculoskeletal: Negative.   Skin: Negative.   Neurological: Negative.   Endo/Heme/Allergies: Negative.   Psychiatric/Behavioral: Negative.     Blood pressure (!) 98/56, pulse 88, temperature 98 F (36.7 C), temperature source Oral, resp. rate 18, height 5\' 9"  (1.753 m), weight 63.5  kg, SpO2 97%. Body mass index is 20.67 kg/m.  Medical Decision Making: Patient meets criteria for inpatient Detox treatment unit.  As a Cytogeneticist we will contact [facility] in [place] and [place] for available bed.  Once he is accepted we will transfer him to the facility.  He is already on CIWA protocol using Ativan Coverage.  We discussed the need to engages in Mental healthcare at the [facility] clinic in [place].  Disposition:  Admit and seek bed placement.  Earney Navy, NP 02/09/2023 10:43 AM

## 2023-07-29 ENCOUNTER — Telehealth: Admitting: Physician Assistant

## 2023-07-29 ENCOUNTER — Ambulatory Visit
Admission: EM | Admit: 2023-07-29 | Discharge: 2023-07-29 | Disposition: A | Discharge status: Home / Self Care | Attending: Family Medicine | Admitting: Family Medicine

## 2023-07-29 ENCOUNTER — Other Ambulatory Visit: Payer: Self-pay

## 2023-07-29 DIAGNOSIS — N4889 Other specified disorders of penis: Secondary | ICD-10-CM

## 2023-07-29 DIAGNOSIS — N489 Disorder of penis, unspecified: Secondary | ICD-10-CM

## 2023-07-29 MED ORDER — DOXYCYCLINE HYCLATE 100 MG PO CAPS: 100.0000 mg | ORAL_CAPSULE | Freq: Two times a day (BID) | ORAL | 0 refills | Status: AC

## 2023-07-29 NOTE — ED Triage Notes (Signed)
 Pt states he has a big bump on the shaft of penisx1wk. Pt states he had a smaller bump on there 2-3 mos ago.

## 2023-07-29 NOTE — Patient Instructions (Signed)
  Constance Dellen, thank you for joining Angelia Kelp, PA-C for today's virtual visit.  While this provider is not your primary care provider (PCP), if your PCP is located in our provider database this encounter information will be shared with them immediately following your visit.   A [facility] MyChart account gives you access to today's visit and all your visits, tests, and labs performed at [facility] " click here if you don't have a [facility] MyChart account or go to [email or web address]  Consent: (Patient) Havok Gellerman provided verbal consent for this virtual visit at the beginning of the encounter.  Current Medications:  Current Outpatient Medications:    bismuth subsalicylate (PEPTO BISMOL) 262 MG/15ML suspension, Take 30 mLs by mouth every 6 (six) hours as needed for indigestion or diarrhea or loose stools., Disp: , Rfl:    cetirizine (ZYRTEC) 10 MG tablet, Take 10 mg by mouth 2 (two) times daily., Disp: , Rfl:    fexofenadine (ALLEGRA) 180 MG tablet, Take 180 mg by mouth daily., Disp: , Rfl:    hyoscyamine  (LEVSIN/SL) 0.125 MG SL tablet, Place 1 tablet (0.125 mg total) under the tongue every 4 (four) hours as needed (pain). Up to 1.25 mg daily, Disp: 30 tablet, Rfl: 0   omeprazole (PRILOSEC) 40 MG capsule, Take 40 mg by mouth daily., Disp: , Rfl:    QUEtiapine  (SEROQUEL ) 200 MG tablet, TAKE ONE TABLET BY MOUTH AT BEDTIME FOR MENTAL HEALTH (Patient not taking: Reported on 02/09/2023), Disp: , Rfl:    sertraline  (ZOLOFT ) 100 MG tablet, Take 100 mg by mouth daily., Disp: , Rfl:    Medications ordered in this encounter:  No orders of the defined types were placed in this encounter.    *If you need refills on other medications prior to your next appointment, please contact your pharmacy*  Follow-Up: Call back or seek an in-person evaluation if the symptoms worsen or if the condition fails to improve as anticipated.  [facility] [phone]    If you have been instructed to have an in-person evaluation today at a local Urgent Care facility, please use the link below. It will take you to a list of all of our available [facility], including address, phone number and hours of operation. Please do not delay care.  [facility]  If you or a family member do not have a primary care provider, use the link below to schedule a visit and establish care. When you choose a [facility] primary care physician or advanced practice provider, you gain a long-term partner in health. Find a Primary Care Provider  Learn more about 's in-office and virtual care options:  - Get Care Now

## 2023-07-29 NOTE — ED Provider Notes (Signed)
 [facility]    CSN: [id number] Arrival date & time: 07/29/23  1221      History   Chief Complaint No chief complaint on file.   HPI Darrell Sanders is a 38 y.o. male presents for a penile bump.  Patient reports he has had 2 bumps to his penis 1 about 2 to 3 months that has not changed and was evaluated by his PCP and he was told not to worry about it.  Second bump began about a week ago just distal to the first bump.  He states this 1 is larger and feels tender with palpation but otherwise denies any pain, swelling, drainage, fevers or chills.  No penile discharge, testicular pain or swelling, dysuria.  No known STD exposure.  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  No other concerns at this time.  HPI  Past Medical History:  Diagnosis Date   Allergy    Arthritis    knee and back, hip   Closed jaw fracture (HCC) 07/2020   Depression    GERD (gastroesophageal reflux disease)    Pneumonia 02/21/2015    Patient Active Problem List   Diagnosis Date Noted   Alcohol use disorder, severe, dependence (HCC) 02/09/2023   CAP (community acquired pneumonia) 03/01/2015    Past Surgical History:  Procedure Laterality Date   COLONOSCOPY     HIP SURGERY     Bone spurs removed   KNEE SURGERY     L arthorscopy       Home Medications    Prior to Admission medications   Medication Sig Start Date End Date Taking? Authorizing Provider  doxycycline  (VIBRAMYCIN ) 100 MG capsule Take 1 capsule (100 mg total) by mouth 2 (two) times daily. 07/29/23  Yes Devion Chriscoe, Jodi R, NP  bismuth subsalicylate (PEPTO BISMOL) 262 MG/15ML suspension Take 30 mLs by mouth every 6 (six) hours as needed for indigestion or diarrhea or loose stools.    [provider]  cetirizine (ZYRTEC) 10 MG tablet Take 10 mg by mouth 2 (two) times daily. 03/20/20   [provider]  fexofenadine (ALLEGRA) 180 MG tablet Take 180 mg by mouth  daily. 07/28/22   [provider]  hyoscyamine  (LEVSIN/SL) 0.125 MG SL tablet Place 1 tablet (0.125 mg total) under the tongue every 4 (four) hours as needed (pain). Up to 1.25 mg daily 12/14/22   Harris, Abigail, PA-C  omeprazole (PRILOSEC) 40 MG capsule Take 40 mg by mouth daily. 05/08/20   [provider]  QUEtiapine  (SEROQUEL ) 200 MG tablet TAKE ONE TABLET BY MOUTH AT BEDTIME FOR MENTAL HEALTH Patient not taking: Reported on 02/09/2023 03/20/20   [provider]  sertraline  (ZOLOFT ) 100 MG tablet Take 100 mg by mouth daily. 10/22/20   [provider]    Family History Family History  Problem Relation Age of Onset   Hypertension Father    Colon cancer Neg Hx    Colon polyps Neg Hx    Esophageal cancer Neg Hx    Rectal cancer Neg Hx    Stomach cancer Neg Hx     Social History Social History   Tobacco Use   Smoking status: Every Day    Current packs/day: 0.25    Average packs/day: 0.3 packs/day for 15.0 years (3.8 ttl pk-yrs)    Types: Cigarettes   Smokeless tobacco: Never  Substance Use Topics   Alcohol use: Yes    Comment: social  Drug use: Yes    Types: Marijuana, Opium     Allergies   Cat dander, Dog epithelium (canis lupus familiaris), and Other   Review of Systems Review of Systems  Genitourinary:        Penile bump     Physical Exam Triage Vital Signs ED Triage Vitals  Encounter Vitals Group     BP 07/29/23 1314 108/74     Systolic BP Percentile --      Diastolic BP Percentile --      Pulse Rate 07/29/23 1314 83     Resp 07/29/23 1314 16     Temp 07/29/23 1314 97.9 F (36.6 C)     Temp Source 07/29/23 1314 Oral     SpO2 07/29/23 1314 97 %     Weight --      Height --      Head Circumference --      Peak Flow --      Pain Score 07/29/23 1313 0     Pain Loc --      Pain Education --      Exclude from Growth Chart --    No data found.  Updated Vital Signs BP 108/74   Pulse 83   Temp 97.9 F (36.6 C) (Oral)    Resp 16   SpO2 97%   Visual Acuity Right Eye Distance:   Left Eye Distance:   Bilateral Distance:    Right Eye Near:   Left Eye Near:    Bilateral Near:     Physical Exam Vitals and nursing note reviewed. Exam conducted with a chaperone present Leeland Pugh Dilger CMA).  Constitutional:      General: He is not in acute distress.    Appearance: Normal appearance. He is not ill-appearing.  HENT:     Head: Normocephalic and atraumatic.  Eyes:     Pupils: Pupils are equal, round, and reactive to light.  Cardiovascular:     Rate and Rhythm: Normal rate.  Pulmonary:     Effort: Pulmonary effort is normal.  Genitourinary:    Penis: Circumcised.        Comments: There is a single penile papule to the left aspect of the mid shaft.  Just distal to this there is a larger papule that is mildly erythematous, minimally fluctuant without induration.  There is no swelling drainage or warmth.  No vesicles or lesions Skin:    General: Skin is warm and dry.  Neurological:     General: No focal deficit present.     Mental Status: He is alert and oriented to person, place, and time.  Psychiatric:        Mood and Affect: Mood normal.        Behavior: Behavior normal.      UC Treatments / Results  Labs (all labs ordered are listed, but only abnormal results are displayed) Labs Reviewed - No data to display  EKG   Radiology No results found.  Procedures Procedures (including critical care time)  Medications Ordered in UC Medications - No data to display  Initial Impression / Assessment and Plan / UC Course  I have reviewed the triage vital signs and the nursing notes.  Pertinent labs & imaging results that were available during my care of the patient were reviewed by me and considered in my medical decision making (see chart for details).     I reviewed exam and symptoms with patient.  No red flags.  Discussed penile papule does  not require treatment.  Concern for possibly  either infected cyst or hair follicle.  Will start doxycycline .  Advised warm compresses and PCP follow-up 2 to 3 days for recheck.  He again states the symptoms are not consistent with his typical HSV infection and declines treatment or testing at this time.  ER precautions reviewed and patient verbalized understanding. Final Clinical Impressions(s) / UC Diagnoses   Final diagnoses:  Penile cyst     Discharge Instructions      Start doxycycline  twice daily for 10 days.  Do warm compresses to the area.  Follow-up with your PCP in 2 days for recheck.  Please go to the ER for any worsening symptoms.  Hope you feel better soon!  ED Prescriptions     Medication Sig Dispense Auth. Provider   doxycycline  (VIBRAMYCIN ) 100 MG capsule Take 1 capsule (100 mg total) by mouth 2 (two) times daily. 20 capsule Emmons Toth, Jodi R, NP      PDMP not reviewed this encounter.   Alleen Arbour, NP 07/29/23 [phone]

## 2023-07-29 NOTE — Progress Notes (Signed)
 Virtual Visit Consent   Darrell Sanders, you are scheduled for a virtual visit with a [facility] provider today. Just as with appointments in the office, your consent must be obtained to participate. Your consent will be active for this visit and any virtual visit you may have with one of our providers in the next 365 days. If you have a MyChart account, a copy of this consent can be sent to you electronically.  As this is a virtual visit, video technology does not allow for your provider to perform a traditional examination. This may limit your provider's ability to fully assess your condition. If your provider identifies any concerns that need to be evaluated in person or the need to arrange testing (such as labs, EKG, etc.), we will make arrangements to do so. Although advances in technology are sophisticated, we cannot ensure that it will always work on either your end or our end. If the connection with a video visit is poor, the visit may have to be switched to a telephone visit. With either a video or telephone visit, we are not always able to ensure that we have a secure connection.  By engaging in this virtual visit, you consent to the provision of healthcare and authorize for your insurance to be billed (if applicable) for the services provided during this visit. Depending on your insurance coverage, you may receive a charge related to this service.  I need to obtain your verbal consent now. Are you willing to proceed with your visit today? Darrell Sanders has provided verbal consent on 07/29/2023 for a virtual visit (video or telephone). Angelia Kelp, PA-C  Date: 07/29/2023 11:18 AM   Virtual Visit via Video Note   I, Angelia Kelp, connected with  Darrell Sanders  ([id number], May 20, 1985) on 07/29/23 at 11:15 AM EDT by a video-enabled telemedicine application and verified that I am speaking with the correct person using two identifiers.  Location: Patient: Virtual Visit  Location Patient: Home Provider: Virtual Visit Location Provider: Home Office   I discussed the limitations of evaluation and management by telemedicine and the availability of in person appointments. The patient expressed understanding and agreed to proceed.    History of Present Illness: Darrell Sanders is a 38 y.o. who identifies as a male who was assigned male at birth, and is being seen today for vesicular bump on shaft of penis noticed a couple months ago by his girlfriend. He saw his PCP and discussed this and the PCP had told him since only one area and small, unchanging, to just watch and monitor. Now, 2 days ago, noticed a second larger lesion near the first. Reports feels cystic as he can grab the lesion and it moves under the skin.    Problems:  Patient Active Problem List   Diagnosis Date Noted   Alcohol use disorder, severe, dependence (HCC) 02/09/2023   CAP (community acquired pneumonia) 03/01/2015    Allergies:  Allergies  Allergen Reactions   Cat Dander Hives   Dog Epithelium (Canis Lupus Familiaris) Hives   Other Other (See Comments)    Elm, cottonwood, and rough pigweed- reactive on allergy test   Medications:  Current Outpatient Medications:    bismuth subsalicylate (PEPTO BISMOL) 262 MG/15ML suspension, Take 30 mLs by mouth every 6 (six) hours as needed for indigestion or diarrhea or loose stools., Disp: , Rfl:    cetirizine (ZYRTEC) 10 MG tablet, Take 10 mg by mouth 2 (two) times daily., Disp: , Rfl:  fexofenadine (ALLEGRA) 180 MG tablet, Take 180 mg by mouth daily., Disp: , Rfl:    hyoscyamine  (LEVSIN/SL) 0.125 MG SL tablet, Place 1 tablet (0.125 mg total) under the tongue every 4 (four) hours as needed (pain). Up to 1.25 mg daily, Disp: 30 tablet, Rfl: 0   omeprazole (PRILOSEC) 40 MG capsule, Take 40 mg by mouth daily., Disp: , Rfl:    QUEtiapine  (SEROQUEL ) 200 MG tablet, TAKE ONE TABLET BY MOUTH AT BEDTIME FOR MENTAL HEALTH (Patient not taking: Reported on  02/09/2023), Disp: , Rfl:    sertraline  (ZOLOFT ) 100 MG tablet, Take 100 mg by mouth daily., Disp: , Rfl:   Observations/Objective: Patient is well-developed, well-nourished in no acute distress.  Resting comfortably at home.  Head is normocephalic, atraumatic.  No labored breathing.  Speech is clear and coherent with logical content.  Patient is alert and oriented at baseline.    Assessment and Plan: 1. Penile lesion (Primary)  - Due to the sensitive nature of the issue and unable to have a chaperone for a full physical exam virtually, he has been advised to seek in person Urgent Care. Discussed calling his PCP, but his PCP is not local and he is unable to get to the town where his PCP is located.  Recommendation to seek in person evaluation at local Urgent Care   Follow Up Instructions: I discussed the assessment and treatment plan with the patient. The patient was provided an opportunity to ask questions and all were answered. The patient agreed with the plan and demonstrated an understanding of the instructions.  A copy of instructions were sent to the patient via MyChart unless otherwise noted below.    The patient was advised to call back or seek an in-person evaluation if the symptoms worsen or if the condition fails to improve as anticipated.    Angelia Kelp, PA-C

## 2023-07-29 NOTE — Discharge Instructions (Addendum)
 Start doxycycline  twice daily for 10 days.  Do warm compresses to the area.  Follow-up with your PCP in 2 days for recheck.  Please go to the ER for any worsening symptoms.  Hope you feel better soon!

## 2023-09-06 ENCOUNTER — Emergency Department (HOSPITAL_BASED_OUTPATIENT_CLINIC_OR_DEPARTMENT_OTHER)

## 2023-09-06 ENCOUNTER — Emergency Department (HOSPITAL_BASED_OUTPATIENT_CLINIC_OR_DEPARTMENT_OTHER)
Admission: EM | Admit: 2023-09-06 | Discharge: 2023-09-06 | Disposition: A | Discharge status: Home / Self Care | Attending: Emergency Medicine | Admitting: Emergency Medicine

## 2023-09-06 ENCOUNTER — Encounter (HOSPITAL_BASED_OUTPATIENT_CLINIC_OR_DEPARTMENT_OTHER): Payer: Self-pay | Admitting: Emergency Medicine

## 2023-09-06 ENCOUNTER — Other Ambulatory Visit: Payer: Self-pay

## 2023-09-06 DIAGNOSIS — S01512A Laceration without foreign body of oral cavity, initial encounter: Secondary | ICD-10-CM | POA: Insufficient documentation

## 2023-09-06 DIAGNOSIS — S0101XA Laceration without foreign body of scalp, initial encounter: Secondary | ICD-10-CM | POA: Diagnosis not present

## 2023-09-06 DIAGNOSIS — Z23 Encounter for immunization: Secondary | ICD-10-CM | POA: Insufficient documentation

## 2023-09-06 DIAGNOSIS — S025XXA Fracture of tooth (traumatic), initial encounter for closed fracture: Secondary | ICD-10-CM | POA: Insufficient documentation

## 2023-09-06 DIAGNOSIS — S0993XA Unspecified injury of face, initial encounter: Secondary | ICD-10-CM | POA: Diagnosis present

## 2023-09-06 MED ORDER — LIDOCAINE-EPINEPHRINE-TETRACAINE (LET) TOPICAL GEL
3.0000 mL | Freq: Once | TOPICAL | Status: AC
  Administered 2023-09-06: 3 mL via TOPICAL
  Filled 2023-09-06: qty 3

## 2023-09-06 MED ORDER — NAPROXEN 250 MG PO TABS
500.0000 mg | ORAL_TABLET | Freq: Once | ORAL | Status: AC
Start: 2023-09-06 — End: 2023-09-06
  Administered 2023-09-06: 500 mg via ORAL
  Filled 2023-09-06: qty 2

## 2023-09-06 MED ORDER — PENICILLIN V POTASSIUM 500 MG PO TABS: 500.0000 mg | ORAL_TABLET | Freq: Four times a day (QID) | ORAL | 0 refills | Status: AC

## 2023-09-06 MED ORDER — PENICILLIN V POTASSIUM 250 MG PO TABS
500.0000 mg | ORAL_TABLET | Freq: Once | ORAL | Status: AC
Start: 2023-09-06 — End: 2023-09-06
  Administered 2023-09-06: 500 mg via ORAL
  Filled 2023-09-06: qty 2

## 2023-09-06 MED ORDER — ACETAMINOPHEN 500 MG PO TABS
1000.0000 mg | ORAL_TABLET | Freq: Once | ORAL | Status: AC
  Administered 2023-09-06: 1000 mg via ORAL
  Filled 2023-09-06: qty 2

## 2023-09-06 MED ORDER — TETANUS-DIPHTH-ACELL PERTUSSIS 5-2.5-18.5 LF-MCG/0.5 IM SUSY
0.5000 mL | PREFILLED_SYRINGE | Freq: Once | INTRAMUSCULAR | Status: AC
  Administered 2023-09-06: 0.5 mL via INTRAMUSCULAR
  Filled 2023-09-06: qty 0.5
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
## 2023-09-06 NOTE — Discharge Instructions (Addendum)
 Eat soft foods only for 1 week: nothing greasy, spicy, no seeds. Mouth rinses twice daily with approved mouth rinse.  No submersion of the scalp wound in any body of water for 2 weeks. Follow up with a dentist for tooth repair.  Take all antibiotics
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
## 2023-09-12 ENCOUNTER — Ambulatory Visit: Admission: EM | Admit: 2023-09-12 | Discharge: 2023-09-12 | Disposition: A | Discharge status: Home / Self Care

## 2023-09-12 DIAGNOSIS — S0101XD Laceration without foreign body of scalp, subsequent encounter: Secondary | ICD-10-CM | POA: Diagnosis not present

## 2023-09-12 DIAGNOSIS — Z4802 Encounter for removal of sutures: Secondary | ICD-10-CM

## 2023-09-12 DIAGNOSIS — Z5189 Encounter for other specified aftercare: Secondary | ICD-10-CM

## 2023-09-12 NOTE — ED Triage Notes (Signed)
 Pt for staple removal to back head-placed 6/16 [facility] ED-NAD-steady gait

## 2023-09-12 NOTE — Discharge Instructions (Addendum)
 Staples removed today.  May wash the area with soap and water but use gentle pressure around this area when washing your hair.  Would avoid cutting the hair for another 2 to 3 days.  Return to urgent care or the ER if needed.

## 2023-09-12 NOTE — ED Provider Notes (Signed)
 [facility]    CSN: [id number] Arrival date & time: 09/12/23  0934      History   Chief Complaint Chief Complaint  Patient presents with   Suture / Staple Removal    HPI Darrell Sanders is a 38 y.o. male.   38 year old male who presents urgent care for wound check.  He was seen in the emergency room on  6/16.  He had staples placed on the left occipital.  He has 4 staples in place.  He has had no complications from this.  He is here for staple removal today.  He denies fevers or chills.  He does report the area has been itching.   Suture / Staple Removal Pertinent negatives include no chest pain, no abdominal pain and no shortness of breath.    Past Medical History:  Diagnosis Date   Allergy    Arthritis    knee and back, hip   Closed jaw fracture (HCC) 07/2020   Depression    GERD (gastroesophageal reflux disease)    Pneumonia 02/21/2015    Patient Active Problem List   Diagnosis Date Noted   Alcohol use disorder, severe, dependence (HCC) 02/09/2023   CAP (community acquired pneumonia) 03/01/2015    Past Surgical History:  Procedure Laterality Date   COLONOSCOPY     HIP SURGERY     Bone spurs removed   KNEE SURGERY     L arthorscopy       Home Medications    Prior to Admission medications   Medication Sig Start Date End Date Taking? Authorizing Provider  bismuth subsalicylate (PEPTO BISMOL) 262 MG/15ML suspension Take 30 mLs by mouth every 6 (six) hours as needed for indigestion or diarrhea or loose stools.    [provider]  cetirizine (ZYRTEC) 10 MG tablet Take 10 mg by mouth 2 (two) times daily. 03/20/20   [provider]  doxycycline  (VIBRAMYCIN ) 100 MG capsule Take 1 capsule (100 mg total) by mouth 2 (two) times daily. 07/29/23   Mayer, Jodi R, NP  fexofenadine (ALLEGRA) 180 MG tablet Take 180 mg by mouth daily. 07/28/22   [provider]  hyoscyamine  (LEVSIN AMIEL) 0.125 MG SL tablet Place 1 tablet (0.125 mg total)  under the tongue every 4 (four) hours as needed (pain). Up to 1.25 mg daily 12/14/22   Harris, Abigail, PA-C  omeprazole (PRILOSEC) 40 MG capsule Take 40 mg by mouth daily. 05/08/20   [provider]  penicillin  v potassium (VEETID) 500 MG tablet Take 1 tablet (500 mg total) by mouth 4 (four) times daily for 7 days. 09/06/23 09/13/23  Palumbo, April, MD  QUEtiapine  (SEROQUEL ) 200 MG tablet TAKE ONE TABLET BY MOUTH AT BEDTIME FOR MENTAL HEALTH Patient not taking: Reported on 02/09/2023 03/20/20   [provider]  sertraline  (ZOLOFT ) 100 MG tablet Take 100 mg by mouth daily. 10/22/20   [provider]    Family History Family History  Problem Relation Age of Onset   Hypertension Father    Colon cancer Neg Hx    Colon polyps Neg Hx    Esophageal cancer Neg Hx    Rectal cancer Neg Hx    Stomach cancer Neg Hx     Social History Social History   Tobacco Use   Smoking status: Every Day    Current packs/day: 0.25    Average packs/day: 0.3 packs/day for 15.0 years (3.8 ttl pk-yrs)    Types: Cigarettes   Smokeless tobacco: Never  Vaping Use  Vaping status: Never Used  Substance Use Topics   Alcohol use: Yes    Comment: daily   Drug use: Yes    Types: Marijuana     Allergies   Cat dander, Dog epithelium (canis lupus familiaris), and Other   Review of Systems Review of Systems  Constitutional:  Negative for chills and fever.  HENT:  Negative for ear pain and sore throat.   Eyes:  Negative for pain and visual disturbance.  Respiratory:  Negative for cough and shortness of breath.   Cardiovascular:  Negative for chest pain and palpitations.  Gastrointestinal:  Negative for abdominal pain and vomiting.  Genitourinary:  Negative for dysuria and hematuria.  Musculoskeletal:  Negative for arthralgias and back pain.  Skin:  Positive for wound (4 staples left posterior scalp). Negative for color change and rash.  Neurological:  Negative for seizures and syncope.   All other systems reviewed and are negative.    Physical Exam Triage Vital Signs ED Triage Vitals  Encounter Vitals Group     BP 09/12/23 0959 (!) 141/83     Girls Systolic BP Percentile --      Girls Diastolic BP Percentile --      Boys Systolic BP Percentile --      Boys Diastolic BP Percentile --      Pulse Rate 09/12/23 0959 82     Resp 09/12/23 0959 20     Temp 09/12/23 0959 97.9 F (36.6 C)     Temp Source 09/12/23 0959 Oral     SpO2 09/12/23 0959 95 %     Weight --      Height --      Head Circumference --      Peak Flow --      Pain Score 09/12/23 0957 0     Pain Loc --      Pain Education --      Exclude from Growth Chart --    No data found.  Updated Vital Signs BP (!) 141/83 (BP Location: Right Arm)   Pulse 82   Temp 97.9 F (36.6 C) (Oral)   Resp 20   SpO2 95%   Visual Acuity Right Eye Distance:   Left Eye Distance:   Bilateral Distance:    Right Eye Near:   Left Eye Near:    Bilateral Near:     Physical Exam Vitals and nursing note reviewed.  Constitutional:      General: He is not in acute distress.    Appearance: He is well-developed.  HENT:     Head: Normocephalic and atraumatic.    Eyes:     Conjunctiva/sclera: Conjunctivae normal.    Cardiovascular:     Rate and Rhythm: Normal rate.  Pulmonary:     Effort: Pulmonary effort is normal. No respiratory distress.   Musculoskeletal:        General: No swelling.     Cervical back: Neck supple.   Skin:    General: Skin is warm and dry.     Capillary Refill: Capillary refill takes less than 2 seconds.   Neurological:     Mental Status: He is alert.   Psychiatric:        Mood and Affect: Mood normal.      UC Treatments / Results  Labs (all labs ordered are listed, but only abnormal results are displayed) Labs Reviewed - No data to display  EKG   Radiology No results found.  Procedures Procedures (including critical care time)  Medications Ordered in  UC Medications - No data to display  Initial Impression / Assessment and Plan / UC Course  I have reviewed the triage vital signs and the nursing notes.  Pertinent labs & imaging results that were available during my care of the patient were reviewed by me and considered in my medical decision making (see chart for details).     Visit for wound check  Laceration of occipital region of scalp, subsequent encounter   Staples removed today.  May wash the area with soap and water but use gentle pressure around this area when washing your hair.  Would avoid cutting the hair for another 2 to 3 days.  Return to urgent care or the ER if needed.  Final Clinical Impressions(s) / UC Diagnoses   Final diagnoses:  Visit for wound check  Laceration of occipital region of scalp, subsequent encounter     Discharge Instructions      Staples removed today.  May wash the area with soap and water but use gentle pressure around this area when washing your hair.  Would avoid cutting the hair for another 2 to 3 days.  Return to urgent care or the ER if needed.     ED Prescriptions   None    PDMP not reviewed this encounter.   Teresa Almarie LABOR, [place] 09/12/23 1010

## 2023-11-19 ENCOUNTER — Emergency Department (HOSPITAL_BASED_OUTPATIENT_CLINIC_OR_DEPARTMENT_OTHER)
Admission: EM | Admit: 2023-11-19 | Discharge: 2023-11-19 | Discharge status: Against Medical Advice | Attending: Emergency Medicine | Admitting: Emergency Medicine

## 2023-11-19 ENCOUNTER — Encounter (HOSPITAL_BASED_OUTPATIENT_CLINIC_OR_DEPARTMENT_OTHER): Payer: Self-pay | Admitting: Emergency Medicine

## 2023-11-19 ENCOUNTER — Other Ambulatory Visit: Payer: Self-pay

## 2023-11-19 DIAGNOSIS — Z5329 Procedure and treatment not carried out because of patient's decision for other reasons: Secondary | ICD-10-CM | POA: Diagnosis not present

## 2023-11-19 DIAGNOSIS — N4889 Other specified disorders of penis: Secondary | ICD-10-CM | POA: Insufficient documentation

## 2023-11-19 NOTE — ED Triage Notes (Signed)
 Pt reports penile cyst x 3 mo, states it has gotten larger recently, denies any discharge

## 2023-11-19 NOTE — ED Provider Notes (Signed)
 [place] [facility] Provider Note   CSN: [id number] Arrival date & time: 11/19/23  1440     Patient presents with: Cyst   Darrell Sanders is a 38 y.o. male.   Patient to ED for evaluation of a cyst to the shaft of the penis that has been there for 3 months. It has been evaluated at the [facility] in [place] but the patient feels nothing was done. It is slightly larger and more painful now. NO drainage. No difficulty urinating or having an erection. No testicular symptoms.   The history is provided by the patient. No language interpreter was used.       Prior to Admission medications   Medication Sig Start Date End Date Taking? Authorizing Provider  bismuth subsalicylate (PEPTO BISMOL) 262 MG/15ML suspension Take 30 mLs by mouth every 6 (six) hours as needed for indigestion or diarrhea or loose stools.    [provider]  cetirizine (ZYRTEC) 10 MG tablet Take 10 mg by mouth 2 (two) times daily. 03/20/20   [provider]  doxycycline  (VIBRAMYCIN ) 100 MG capsule Take 1 capsule (100 mg total) by mouth 2 (two) times daily. 07/29/23   Mayer, Jodi R, NP  fexofenadine (ALLEGRA) 180 MG tablet Take 180 mg by mouth daily. 07/28/22   [provider]  hyoscyamine  (LEVSIN AMIEL) 0.125 MG SL tablet Place 1 tablet (0.125 mg total) under the tongue every 4 (four) hours as needed (pain). Up to 1.25 mg daily 12/14/22   Harris, Abigail, PA-C  omeprazole (PRILOSEC) 40 MG capsule Take 40 mg by mouth daily. 05/08/20   [provider]  QUEtiapine  (SEROQUEL ) 200 MG tablet TAKE ONE TABLET BY MOUTH AT BEDTIME FOR MENTAL HEALTH Patient not taking: Reported on 02/09/2023 03/20/20   [provider]  sertraline  (ZOLOFT ) 100 MG tablet Take 100 mg by mouth daily. 10/22/20   [provider]    Allergies: Cat dander, Dog epithelium (canis lupus familiaris), and Other    Review of Systems  Updated Vital Signs BP 112/84 (BP Location:  Left Arm)   Pulse 94   Temp 98.4 F (36.9 C) (Oral)   Resp 18   Ht 5' 9 (1.753 m)   Wt 60.3 kg   SpO2 100%   BMI 19.64 kg/m   Physical Exam Vitals reviewed.  Constitutional:      Appearance: He is well-developed.  Cardiovascular:     Rate and Rhythm: Normal rate.  Pulmonary:     Effort: Pulmonary effort is normal.  Genitourinary:    Comments: Small, 1 cm diameter uniform cyst to base of penis. No erythema. It is firm without fluctuance. Moderately tender.  Musculoskeletal:        General: Normal range of motion.     Cervical back: Normal range of motion.  Skin:    General: Skin is warm and dry.  Neurological:     Mental Status: He is alert and oriented to person, place, and time.     (all labs ordered are listed, but only abnormal results are displayed) Labs Reviewed - No data to display  EKG: None  Radiology: No results found.   Procedures   Medications Ordered in the ED - No data to display  Clinical Course as of 11/19/23 1731  Fri Nov 19, 2023  1730 Patient to ED with penile cyst x 3 months. After initial exam, I discussed work up with ED attending Pamella who advised to obtain an STD panel and refer to urology.  I went back to discuss the plan with the patient but found he had left the department.  [SU]    Clinical Course User Index [SU] Odell Balls, PA-C                                 Medical Decision Making       Final diagnoses:  Penile cyst    ED Discharge Orders     None          Odell Balls, PA-C 11/19/23 1732    Pamella Ozell LABOR, DO 11/26/23 1137

## 2024-02-26 ENCOUNTER — Other Ambulatory Visit: Payer: Self-pay

## 2024-02-26 ENCOUNTER — Encounter (HOSPITAL_COMMUNITY): Payer: Self-pay

## 2024-02-26 ENCOUNTER — Emergency Department (HOSPITAL_COMMUNITY)
Admission: EM | Admit: 2024-02-26 | Discharge: 2024-02-26 | Disposition: A | Discharge status: Home / Self Care | Attending: Emergency Medicine | Admitting: Emergency Medicine

## 2024-02-26 DIAGNOSIS — S01312A Laceration without foreign body of left ear, initial encounter: Secondary | ICD-10-CM | POA: Diagnosis not present

## 2024-02-26 DIAGNOSIS — S0991XA Unspecified injury of ear, initial encounter: Secondary | ICD-10-CM | POA: Diagnosis present

## 2024-02-26 MED ORDER — AMOXICILLIN-POT CLAVULANATE 875-125 MG PO TABS
1.0000 | ORAL_TABLET | Freq: Once | ORAL | Status: AC
  Administered 2024-02-26: 1 via ORAL
  Filled 2024-02-26: qty 1

## 2024-02-26 MED ORDER — AMOXICILLIN-POT CLAVULANATE 875-125 MG PO TABS: 1.0000 | ORAL_TABLET | Freq: Two times a day (BID) | ORAL | 0 refills | Status: AC

## 2024-02-26 NOTE — Discharge Instructions (Signed)
 Your wound was repaired this evening with Dermabond.  This will fall off/go away on its own.  You may get the wound wet after 24 hours.  Do not scrub the wound.  I have prescribed antibiotics as a precaution.  Please take as directed.  If you desire improved cosmetic results due to the missing portion of your ear I would recommend following up with plastic surgery.
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
